# Patient Record
Sex: Female | Born: 1965 | Race: White | Hispanic: No | Smoking: Former smoker
Health system: Southern US, Community
[De-identification: ages and names within clinical notes are randomized; demographics above are authoritative.]

## PROBLEM LIST (undated history)

## (undated) DIAGNOSIS — C50919 Malignant neoplasm of unspecified site of unspecified female breast: Secondary | ICD-10-CM

## (undated) DIAGNOSIS — E079 Disorder of thyroid, unspecified: Secondary | ICD-10-CM

## (undated) DIAGNOSIS — E063 Autoimmune thyroiditis: Secondary | ICD-10-CM

## (undated) DIAGNOSIS — C44319 Basal cell carcinoma of skin of other parts of face: Secondary | ICD-10-CM

## (undated) HISTORY — PX: TOTAL LAPAROSCOPIC HYSTERECTOMY WITH BILATERAL SALPINGO OOPHORECTOMY: SHX6845

## (undated) HISTORY — DX: Basal cell carcinoma of skin of other parts of face: C44.319

## (undated) HISTORY — DX: Disorder of thyroid, unspecified: E07.9

---

## 1999-05-07 ENCOUNTER — Emergency Department (HOSPITAL_COMMUNITY): Admission: EM | Admit: 1999-05-07 | Discharge: 1999-05-07 | Payer: Self-pay

## 1999-05-08 ENCOUNTER — Encounter: Payer: Self-pay | Admitting: Gastroenterology

## 1999-05-08 ENCOUNTER — Ambulatory Visit (HOSPITAL_COMMUNITY): Admission: RE | Admit: 1999-05-08 | Discharge: 1999-05-08 | Payer: Self-pay | Admitting: Gastroenterology

## 1999-05-26 ENCOUNTER — Encounter: Payer: Self-pay | Admitting: Gastroenterology

## 1999-05-26 ENCOUNTER — Ambulatory Visit (HOSPITAL_COMMUNITY): Admission: RE | Admit: 1999-05-26 | Discharge: 1999-05-26 | Payer: Self-pay | Admitting: Gastroenterology

## 1999-09-04 ENCOUNTER — Ambulatory Visit (HOSPITAL_COMMUNITY): Admission: RE | Admit: 1999-09-04 | Discharge: 1999-09-04 | Payer: Self-pay | Admitting: Gastroenterology

## 1999-09-04 ENCOUNTER — Encounter: Payer: Self-pay | Admitting: Gastroenterology

## 1999-11-27 ENCOUNTER — Other Ambulatory Visit: Admission: RE | Admit: 1999-11-27 | Discharge: 1999-11-27 | Payer: Self-pay | Admitting: *Deleted

## 2003-08-10 ENCOUNTER — Other Ambulatory Visit: Admission: RE | Admit: 2003-08-10 | Discharge: 2003-08-10 | Payer: Self-pay | Admitting: Obstetrics and Gynecology

## 2004-03-15 ENCOUNTER — Inpatient Hospital Stay (HOSPITAL_COMMUNITY): Admission: AD | Admit: 2004-03-15 | Discharge: 2004-03-17 | Payer: Self-pay | Admitting: Obstetrics and Gynecology

## 2006-05-07 ENCOUNTER — Encounter: Admission: RE | Admit: 2006-05-07 | Discharge: 2006-05-07 | Payer: Self-pay | Admitting: Obstetrics and Gynecology

## 2007-05-09 ENCOUNTER — Encounter: Admission: RE | Admit: 2007-05-09 | Discharge: 2007-05-09 | Payer: Self-pay | Admitting: Obstetrics and Gynecology

## 2008-03-05 ENCOUNTER — Encounter: Admission: RE | Admit: 2008-03-05 | Discharge: 2008-03-05 | Payer: Self-pay | Admitting: Family Medicine

## 2008-06-03 ENCOUNTER — Encounter: Admission: RE | Admit: 2008-06-03 | Discharge: 2008-06-03 | Payer: Self-pay | Admitting: Obstetrics and Gynecology

## 2009-06-08 ENCOUNTER — Encounter: Admission: RE | Admit: 2009-06-08 | Discharge: 2009-06-08 | Payer: Self-pay | Admitting: Obstetrics and Gynecology

## 2009-07-25 ENCOUNTER — Encounter: Admission: RE | Admit: 2009-07-25 | Discharge: 2009-07-25 | Payer: Self-pay | Admitting: Family Medicine

## 2010-07-04 ENCOUNTER — Encounter: Admission: RE | Admit: 2010-07-04 | Discharge: 2010-07-04 | Payer: Self-pay | Admitting: Obstetrics and Gynecology

## 2010-12-22 NOTE — H&P (Signed)
NAME:  Jillian Hunt, Jillian Hunt                    ACCOUNT NO.:  [id number]   MEDICAL RECORD NO.:  [id number]                   PATIENT TYPE:  INP   LOCATION:  9133                                 FACILITY:  [facility]   PHYSICIAN:  Maxie Better, M.D.            DATE OF BIRTH:  15-Oct-1965   DATE OF ADMISSION:  03/15/2004  DATE OF DISCHARGE:                                HISTORY & PHYSICAL   CHIEF COMPLAINT:  Favorable cervix.  Suspect fetal macrosomia.   HISTORY OF PRESENT ILLNESS:  This is a 45 year old, gravida 3, para 1-0-1-1,  married white female, last menstrual period of June 11, 2003, [facility] of  March 17, 2004, consistent with an ultrasound done on July 27, 2003 at  6.[redacted] weeks gestation, who is now at 71 plus weeks gestation, being admitted  for induction of labor secondary to favorable cervix and suspicion of fetal  macrosomia.  The patient underwent an ultrasound on March 13, 2004, at which  time the estimated fetal weight was 4241 gm, 9 pounds, 7 ounces, 95%  percentile.  Upper normal amniotic fluid, with an amniotic fluid index of  19.5, which is at the 96th percentile.  The patient's history is notable for  a previous vacuum extraction of an 8 pound, 2 ounce baby after prolonged  pushing.  She reports a history of postpartum hemorrhage subsequently;  however, the review of the report and her records did not report that.  The  patient had been seen by Dr. Seymour Bars on March 10, 2004 in the office for  complaints of abdominal pruritic rash, which was subsequently diagnosed at  [facility].  The patient returned on March 13, 2004 for [facility] care with a fundal  height of 43 cm, and the ultrasound as noted.  The patient, based on the  ultrasound finding, was offered a cesarean section versus induction of  labor.  The patient was scheduled to see me on March 15, 2004 for a final  decision regarding the mode of delivery.  The patient's cervix was found to  be 3 cm, 80%, and -3 vertex  presentation.  Pros and cons of vaginal versus  primary cesarean section, issue of shoulder dystocia which is reduced with a  cesarean section but not eliminated, were reviewed with the patient and her  husband.  The possibility of need for cesarean section for arrest of  dilatation or arrest of descent was discussed with the patient.  She  initially opted to proceed with her scheduled cesarean section on March 17, 2004, and subsequently called and requested to be induced with the  understanding that it might not be successful, and she may had a cesarean  section as a result of the above possible reasons.  The patient has had some  pelvic pressure, no real notable contractions.  She has had good fetal  movements.  Her pregnancy otherwise has been uncomplicated.   History is notable for herpes simplex virus in the  past, for which she was  placed on suppressive therapy starting at 35 weeks.  Her group B strep  culture was negative.   Prenatal care was at [facility].   PRIMARY OBSTETRICIAN:  Maxie Better, M.D.   PRENATAL LABORATORIES:  Blood type was B positive.  Antibody screen  negative.  RPR was nonreactive.  Rubella was immune.  Hepatitis B surface  antigen negative.  HIV test was negative.  Glucose challenge test on November 28, 2003 was 117.  Group B strep culture was negative.  GC and Chlamydia  cultures were negative.  Pap smear was normal on August 10, 2003.  The  patient had first trimester genetic screening at the [facility] office.  She also had an AFP that was normal for open neural tube defect.  This was  done on October 07, 2003.  Normal anatomic fetal survey on November 02, 2003, at  which point she was 20.4 weeks.   PAST MEDICAL HISTORY:  SULFA produces hives.   MEDICATIONS:  1. Valtrex.  2. Prenatal vitamins.   MEDICAL HISTORY:  1. Prior history of hypothyroidism with no medications.  2. Prior history of asthma.  No recent exacerbation.  3. Irritable  bowel syndrome with no medications.   SURGERY:  1. Cryosurgery in 1991.  2. D&E in 1989.   OBSTETRICAL HISTORY:  1. In 1989, TAB.  2. In October of 2003, an 8 pound, 4 ounce baby at 38 weeks.  Six hours of     labor.  Vaginal delivery.  Placenta was difficult in removal, with     subsequent heavy bleeding per patient.   GYNECOLOGICAL HISTORY:  1. History of abnormal Paps which required surgery in the past, with normal     Pap smear since that time.  2. Herpes simplex virus in the past.   FAMILY HISTORY:  Sister with Graves' disease.  Maternal aunt with breast  cancer.  No ovarian or colon cancer.   SOCIAL HISTORY:  Married.  Nonsmoker.  One daughter.  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   REVIEW OF SYSTEMS:  Negative, except as noted in the history of present  illness.   PHYSICAL EXAMINATION:  GENERAL:  Well-developed, well-nourished, gravid  white female in no acute distress.  VITAL SIGNS:  Blood pressure 106/68, weight 183 pounds.  Fetal heart rate is  134.  SKIN:  An erythematous rash on abdomen, legs, and upper arms.  HEENT:  Anicteric sclerae.  Pink conjunctivae.  Oropharynx negative.  NECK:  Supple.  LUNGS:  Clear to auscultation.  HEART:  Regular rate and rhythm without murmur.  ABDOMEN:  Gravid.  Fundal height of 44 cm.  PELVIC:  3, 80, -3, vertex.  EXTREMITIES:  No edema.   IMPRESSION:  1. Term gestation.  2. Suspect possible large for gestational age baby.  3. History of herpes simplex with no recent outbreak.   PLAN:  Admission.  Amniotomy.  Routine admission laboratories.  Intrauterine  pressure catheter.  Low-dose Pitocin.  Epidural anesthesia.  A lengthy  discussion ensued with the patient prior to her induction regarding risk of  increased bleeding from a vaginal delivery or cesarean section, the  possibility of shoulder dystocia, and the risks associated with that, including nerve injury to the baby, possible need for  cesarean section.  Risk of  cesarean section was reviewed with the patient,  as well, and recommendations for an epidural anesthesia, in light of the  possible need for cesarean section if this is  not a successful vaginal  delivery.                                               Maxie Better, M.D.    [place]/MEDQ  D:  03/15/2004  T:  03/15/2004  Job:  [id number]

## 2011-08-13 ENCOUNTER — Other Ambulatory Visit: Payer: Self-pay | Admitting: Obstetrics and Gynecology

## 2011-08-13 DIAGNOSIS — Z1231 Encounter for screening mammogram for malignant neoplasm of breast: Secondary | ICD-10-CM

## 2011-08-24 ENCOUNTER — Ambulatory Visit
Admission: RE | Admit: 2011-08-24 | Discharge: 2011-08-24 | Disposition: A | Discharge status: Home / Self Care | Payer: BC Managed Care – PPO | Source: Ambulatory Visit | Attending: Obstetrics and Gynecology | Admitting: Obstetrics and Gynecology

## 2011-08-24 DIAGNOSIS — Z1231 Encounter for screening mammogram for malignant neoplasm of breast: Secondary | ICD-10-CM

## 2011-12-17 IMAGING — US US ABDOMEN COMPLETE
1 series · 14 of 25 positions shown · non-contrast
Comparison: Ultrasound of the abdomen of 03/05/2008

CLINICAL DATA: Epigastric pain

COMPLETE ABDOMINAL ULTRASOUND

[Series 1: us abdomen complete · 0.24mm/px · 14 of 88 slices shown]
[im 1/88]
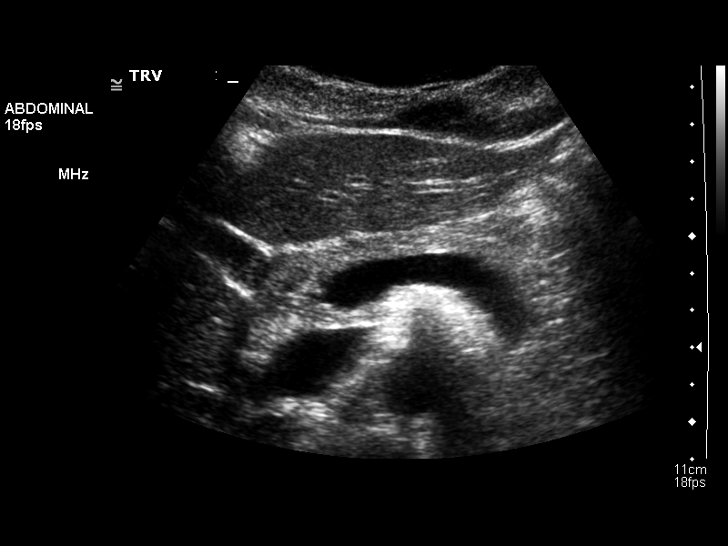
[im 8/88]
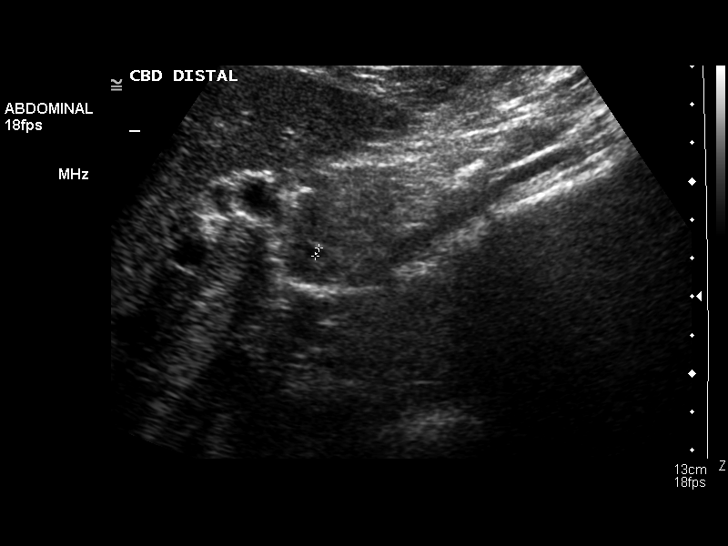
[im 15/88]
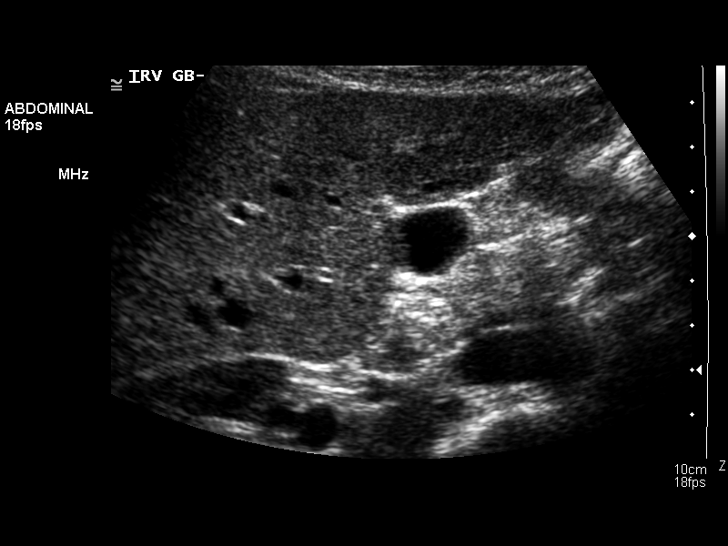
[im 22/88]
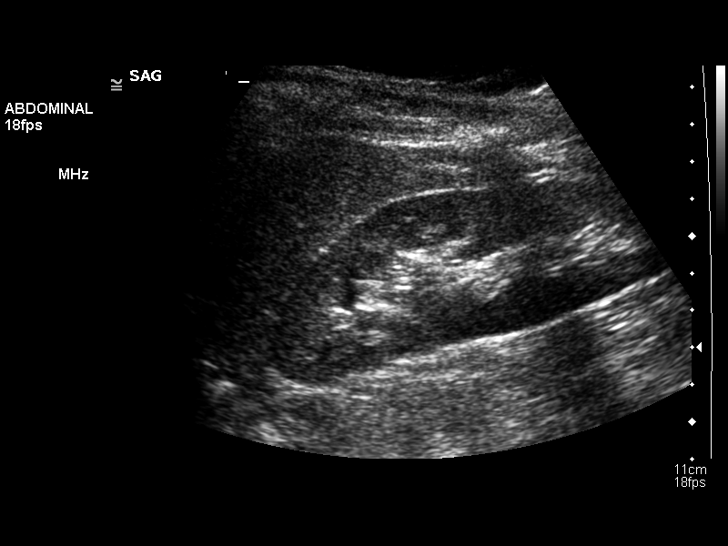
[im 30/88]
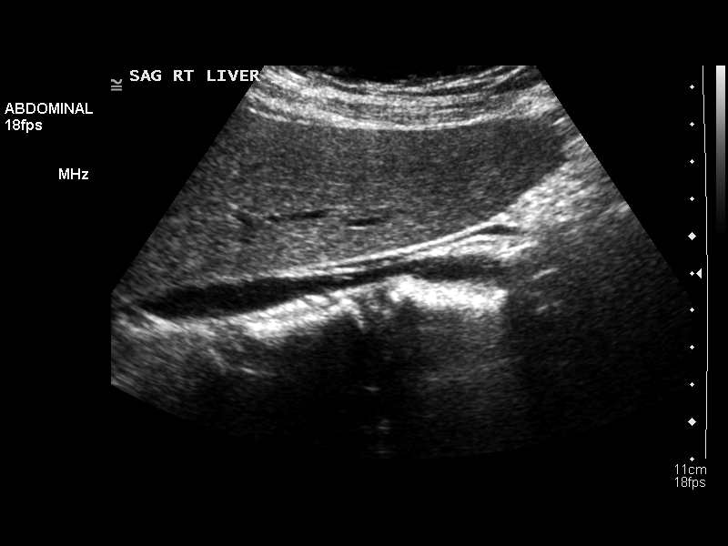
[im 33/88]
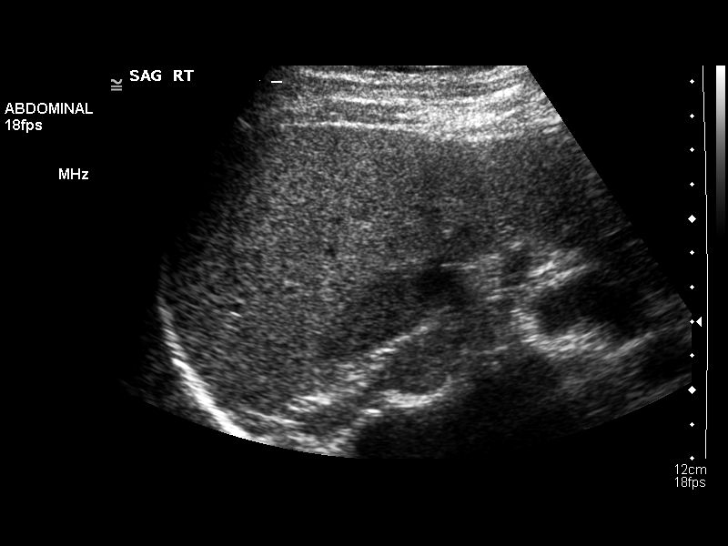
[im 40/88]
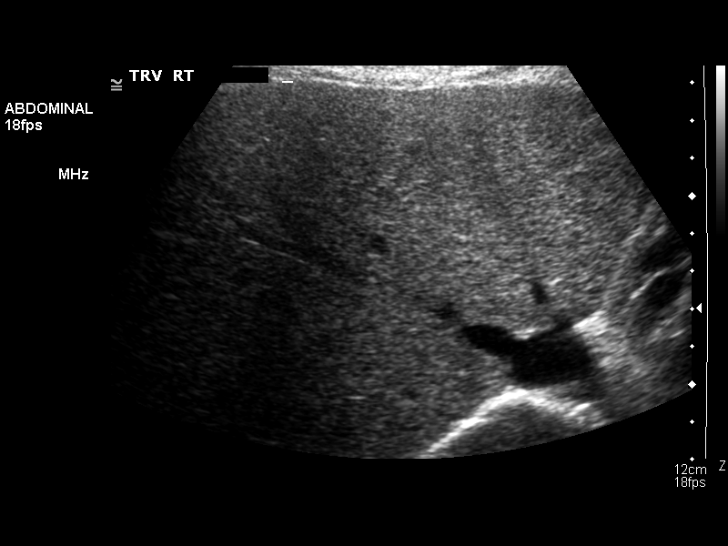
[im 48/88]
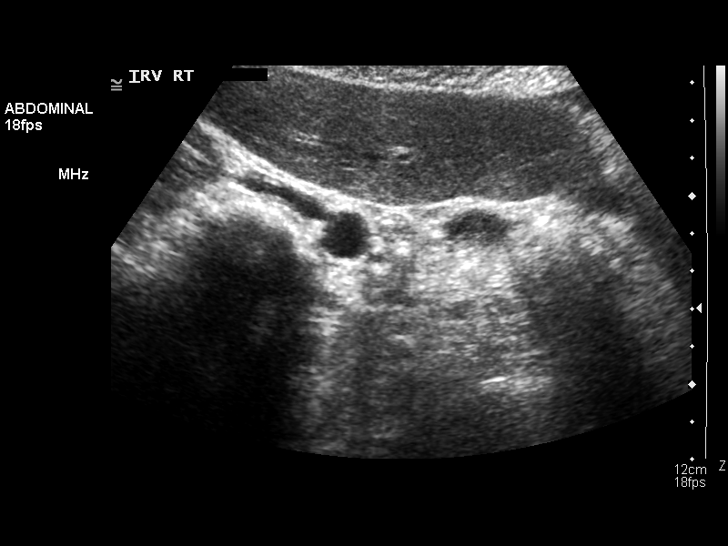
[im 55/88]
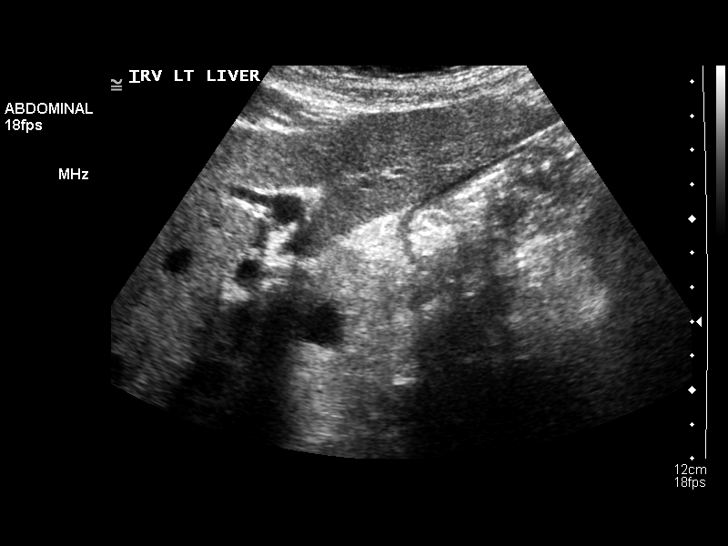
[im 59/88]
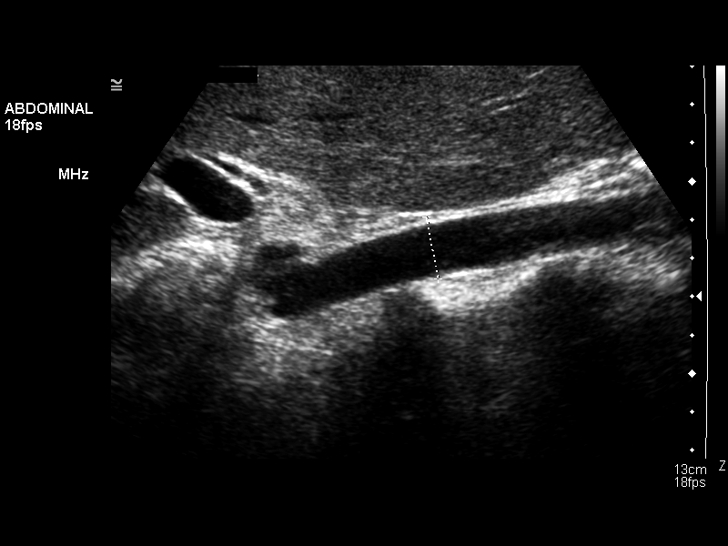
[im 66/88]
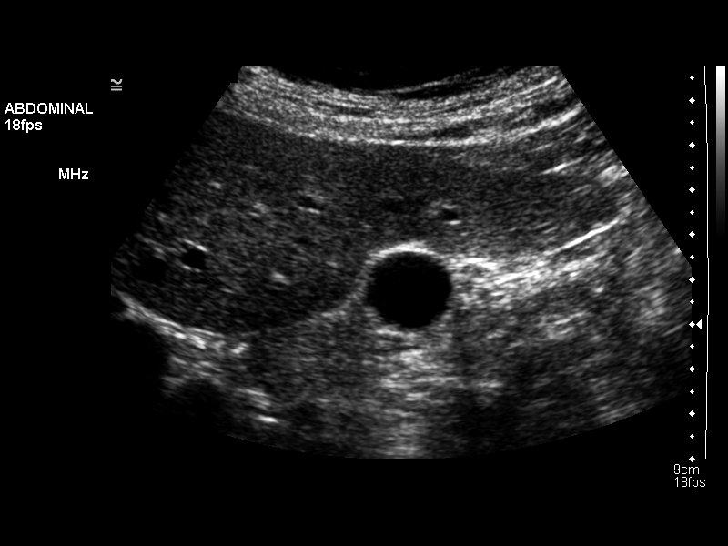
[im 73/88]
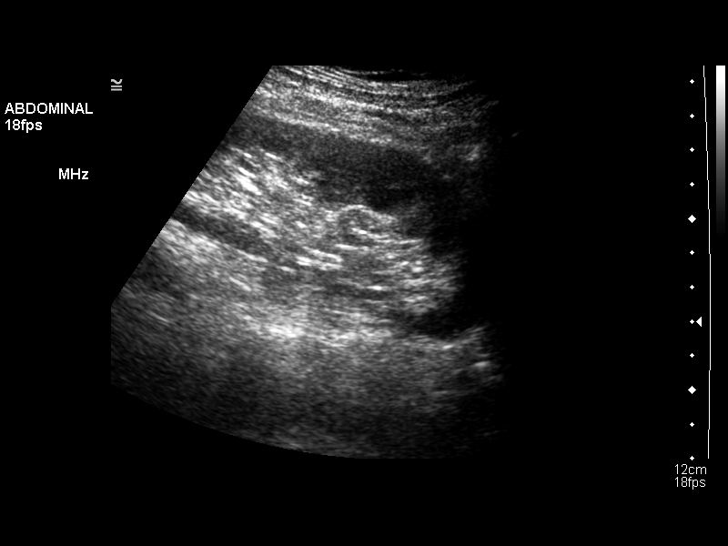
[im 80/88]
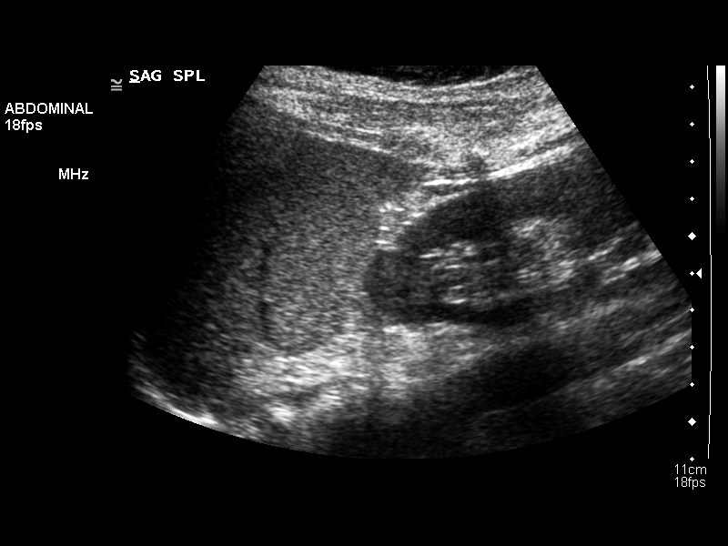
[im 88/88]
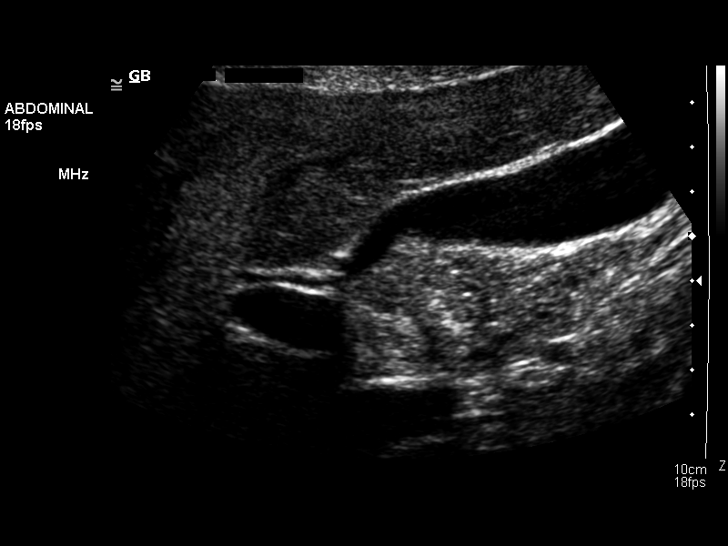

[14 of 25 positions shown; findings below may reference images not displayed]

FINDINGS: Gallbladder:  The gallbladder is well seen and no gallstones are
noted.  There is no pain over the gallbladder upon compression.

Common bile duct:  The common bile duct is normal measuring 4.3 mm
in diameter.

Liver:  The liver has a normal echogenic pattern.  A prominent
right lobe is consistent with normal variation of [place] lobe.

IVC:  Appears normal.

Pancreas:  No focal abnormality seen.

Spleen:  The spleen is normal measuring 10.0 cm sagittally.

Right Kidney:  No hydronephrosis is seen.  The right kidney
measures 12.4 cm sagittally.

Left Kidney:  No hydronephrosis.  The left kidney measures 12.5 cm.

Abdominal aorta:  The abdominal aorta is normal in caliber.
IMPRESSION: Negative abdominal ultrasound.

## 2012-08-18 ENCOUNTER — Other Ambulatory Visit: Payer: Self-pay | Admitting: Obstetrics and Gynecology

## 2012-08-18 DIAGNOSIS — Z1231 Encounter for screening mammogram for malignant neoplasm of breast: Secondary | ICD-10-CM

## 2012-09-05 ENCOUNTER — Ambulatory Visit
Admission: RE | Admit: 2012-09-05 | Discharge: 2012-09-05 | Disposition: A | Discharge status: Home / Self Care | Payer: BC Managed Care – PPO | Source: Ambulatory Visit | Attending: Obstetrics and Gynecology | Admitting: Obstetrics and Gynecology

## 2012-09-05 DIAGNOSIS — Z1231 Encounter for screening mammogram for malignant neoplasm of breast: Secondary | ICD-10-CM

## 2013-03-19 ENCOUNTER — Other Ambulatory Visit: Payer: Self-pay | Admitting: Obstetrics and Gynecology

## 2013-03-19 DIAGNOSIS — N63 Unspecified lump in unspecified breast: Secondary | ICD-10-CM

## 2013-03-20 ENCOUNTER — Other Ambulatory Visit: Payer: Self-pay

## 2013-03-20 DIAGNOSIS — C50919 Malignant neoplasm of unspecified site of unspecified female breast: Secondary | ICD-10-CM

## 2013-03-20 HISTORY — DX: Malignant neoplasm of unspecified site of unspecified female breast: C50.919

## 2013-03-23 ENCOUNTER — Other Ambulatory Visit: Payer: Self-pay | Admitting: Radiology

## 2013-03-23 DIAGNOSIS — C50911 Malignant neoplasm of unspecified site of right female breast: Secondary | ICD-10-CM

## 2013-03-26 ENCOUNTER — Other Ambulatory Visit (INDEPENDENT_AMBULATORY_CARE_PROVIDER_SITE_OTHER): Payer: Self-pay

## 2013-03-26 ENCOUNTER — Encounter (INDEPENDENT_AMBULATORY_CARE_PROVIDER_SITE_OTHER): Payer: Self-pay | Admitting: Surgery

## 2013-03-26 ENCOUNTER — Ambulatory Visit (INDEPENDENT_AMBULATORY_CARE_PROVIDER_SITE_OTHER): Payer: BC Managed Care – PPO | Admitting: Surgery

## 2013-03-26 VITALS — BP 132/92 | HR 88 | Temp 98.8°F | Resp 15 | Ht 67.0 in | Wt 155.0 lb

## 2013-03-26 DIAGNOSIS — C50911 Malignant neoplasm of unspecified site of right female breast: Secondary | ICD-10-CM

## 2013-03-26 DIAGNOSIS — D0592 Unspecified type of carcinoma in situ of left breast: Secondary | ICD-10-CM

## 2013-03-26 DIAGNOSIS — C50919 Malignant neoplasm of unspecified site of unspecified female breast: Secondary | ICD-10-CM | POA: Insufficient documentation

## 2013-03-26 DIAGNOSIS — C50912 Malignant neoplasm of unspecified site of left female breast: Secondary | ICD-10-CM

## 2013-03-26 NOTE — Progress Notes (Addendum)
Re:   Jillian Hunt DOB:   October 31, 1965 MRN:   [id number]  ASSESSMENT AND PLAN: 1.  Breast cancer, right.  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  Two areas on [place] - 3.9 cm (12:30 o'clock) and 1.6 cm (11:30 o'clock) on [place].  I discussed the options for breast cancer treatment with the patient.  I discussed a multidisciplinary approach to the treatment of breast cancer, which includes medical oncology and radiation oncology.  I discussed the surgical options of lumpectomy vs. mastectomy.  If mastectomy, there is the possibility of reconstruction.  Though with the size tumor she has now and the possibility of radiation tx, reconstruction may need to be delayed.   I discussed the options of lymph node biopsy.  The treatment plan depends on the pathologic staging of the tumor and the patient's personal wishes.  The risks of surgery include, but are not limited to, bleeding, infection, the need for further surgery, and nerve injury.  The patient has been given literature on the treatment of breast cancer from [facility].  She has the "Breast Cancer Journey book".  Plan:  1) MRI tomorrow, 2) Medical oncology consult  (she has requested Dr. Darnelle Catalan) to consider neoadjuvant chemotx, 3) Radiation oncology consult, 4) Genetics consultation, 5) I discussed the possibility of power port  When I first saw her, I did not have the reports from [place].  But with the reports, it is unclear whether she is still a lumpectomy candidate.  Will see what the MRI shows.  If she has multicentric disease, she will need a mastectomy.  If the two tumors in the right breast are close together and respond to neoadjuvant treatment, she may still be a candidate for "lumpectomy".  The tumor also appears to be a lobular carcinoma, which would be less likely to respond to neoadjuvant chemotx.  Will talk to patient after  MRI and med oncology consult.  [MRI - 03/27/2013 - In the right breast the mass measures 3.7 x 2.5 x 2.6 cm. There are multiple other masses/nodules located  lateral and inferior-lateral to the mass. The mass and additional  smaller masses/nodules measures 5.2 x 2.5 x 4.9 cm.   I think that this makes her a poor breast conservation candidate.  She has seen Dr. Ala Bent.  Dr. Darnelle Catalan has started Zoladex/Anastrazole.  Plan: 1) Oncotype to see if she will need chemotherapy, 2) genetics pending, 3) consider plastic surgery while we are waiting, but because of the risk of rad tx, she may not be a good candidate for immediate reconstruction.  I tried to call the patient, left message on answering machine.  DN  04/08/2013]  [Spoke with patient.  She is seeing Dr. Amie Critchley ([facility]) Wed., 9/10.  We will set her up to see a plastic surgeon here.  DN  04/09/2013] [She has decided to go to [facility].  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  She got at PET scan, CT scan, and bone scan.  She has a suspicious lesion in her liver - liver biopsy pending.  DN 04/21/2013]  2.  I discussed the possibility of a power port  I discussed the benefits and risks.  The risks include, but are not limited to, bleeding, infection, pneumothorax, and thrombosis.  3.  History of Hashimoto's - on thyroid replacement 4.  Has been on supplement estrogen for about one  year - she has stopped this.  Chief Complaint  Patient presents with  . New Evaluation    eva lft br cancer   REFERRING PHYSICIAN: MCNEILL,WENDY, MD  HISTORY OF PRESENT ILLNESS: Jillian Hunt is a 47 y.o. (DOB: 01/31/1966)  white  female whose primary care physician is MCNEILL,WENDY, MD and comes to me today for a newly diagnosed right breast cancer.  Her notes sort of trickled in while she was in the office, but I think I have all the data that has been collected as of now.  The patient felt a mass/fullness in her right breast about 3 to 4 weeks ago.  Her most  recent mammogram was at [facility] in 09/08/2012 and was negative.  She had an appointment to see Dr. Nelta Numbers in a couple of weeks after finding the mass and showed the area to her.  Dr. Cherly Hensen sent her for a mammogram. She had a mammogram/[facility] at [facility] on 03/20/2013. There appear to be two masses seen on [place].  One was 3.9 cm (12:30 o'clock) in diameter and one was 1.6 cm (11:30 o'clock) in diameter.  She had a biopsy of the larger mass in the right breast on the same day.  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  She has been on estrogen for about 1 year for peri menopausal symptoms.  She knows to stop this. Her last period was about one year ago.  She has an aunt (mother's sister) who had breast cancer.  She has two children and breast feed both.   Past Medical History  Diagnosis Date  . Thyroid disease      History reviewed. No pertinent past surgical history.    Current Outpatient Prescriptions  Medication Sig Dispense Refill  . levothyroxine (SYNTHROID, LEVOTHROID) 50 MCG tablet Take 50 mcg by mouth daily before breakfast.      . loratadine (CLARITIN) 10 MG tablet Take 10 mg by mouth daily.      . norethindrone-ethinyl estradiol (JUNEL FE,GILDESS FE,LOESTRIN FE) 1-20 MG-MCG tablet Take 1 tablet by mouth daily.      Marland Kitchen zolpidem (AMBIEN) 10 MG tablet Take 10 mg by mouth at bedtime as needed for sleep.       No current facility-administered medications for this visit.      Allergies  Allergen Reactions  . Sulfa Antibiotics Hives and Itching    REVIEW OF SYSTEMS: Skin:  No history of rash.  No history of abnormal moles. Infection:  No history of hepatitis or HIV.  No history of MRSA. Neurologic:  No history of stroke.  No history of seizure.  No history of headaches. Cardiac:  No history of hypertension. No history of heart disease.  No history of prior cardiac  catheterization.  No history of seeing a cardiologist. Pulmonary:  Does not smoke cigarettes.  No asthma or bronchitis.  No OSA/CPAP.  Endocrine:  No diabetes. History of Hashimoto's - on thyroid replacement.  At one time saw Dr. Talmage Nap, but now is followed by Dr. Uvaldo Rising. Gastrointestinal:  No history of stomach disease.  No history of liver disease.  No history of gall bladder disease.  No history of pancreas disease.  No history of colon disease. Urologic:  No history of kidney stones.  No history of bladder infections. Musculoskeletal:  No history of joint or back disease. Hematologic:  No bleeding disorder.  No history of anemia.  Not anticoagulated. Psycho-social:  The patient is oriented.   The patient has no obvious psychologic or social impairment to understanding our conversation and plan.  SOCIAL and FAMILY HISTORY: Married. Husband with her. Children are 9 and 45. 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  PHYSICAL EXAM: BP 132/92  Pulse 88  Temp(Src) 98.8 F (37.1 C) (Temporal)  Resp 15  Ht 5\' 7"  (1.702 m)  Wt 155 lb (70.308 kg)  BMI 24.27 kg/m2  General: WN WF who is alert and generally healthy appearing.  HEENT: Normal. Pupils equal. Neck: Supple. No mass.  No thyroid mass. Lymph Nodes:  No supraclavicular, cervical, or axillary nodes Lungs: Clear to auscultation and symmetric breath sounds. Heart:  RRR. No murmur or rub. Breast:  Right:  3 cm mass with bruise at 12 o'clock.  I do not feel the second mass seen on the [place] at [facility].  Left:  No mass  Abdomen: Soft. No mass. No tenderness. No hernia. Normal bowel sounds.  No abdominal scars. Rectal: Not done. Extremities:  Good strength and ROM  in upper and lower extremities. Neurologic:  Grossly intact to motor and sensory function. Psychiatric: Has normal mood and affect. Behavior is normal.   [place]:  I did an US of the larger mass in the office for a baseline size.  I measured  the tumor at 2.6 x 2.5 x 1.7 cm.  [photos taken].  DATA REVIEWED: Epic and notes from [place].  Ovidio Kin, MD,  [facility] [address]  [address]   [place], [place]    [address] Phone:  [phone] FAX:  [phone]

## 2013-03-27 ENCOUNTER — Ambulatory Visit
Admission: RE | Admit: 2013-03-27 | Discharge: 2013-03-27 | Disposition: A | Discharge status: Home / Self Care | Payer: BC Managed Care – PPO | Source: Ambulatory Visit | Attending: Radiology | Admitting: Radiology

## 2013-03-27 ENCOUNTER — Encounter: Payer: Self-pay | Admitting: Radiation Oncology

## 2013-03-27 ENCOUNTER — Telehealth: Payer: Self-pay | Admitting: *Deleted

## 2013-03-27 MED ORDER — GADOBENATE DIMEGLUMINE 529 MG/ML IV SOLN
14.0000 mL | Freq: Once | INTRAVENOUS | Status: AC | PRN
  Administered 2013-03-27: 14 mL via INTRAVENOUS

## 2013-03-27 NOTE — Progress Notes (Signed)
Location of Breast Cancer: right, 2 masses- 12;30 o'clock, 11:30 o'clock  Histology per Pathology Report:  03/20/13 Diagnosis Breast, right, needle core biopsy, mass - INVASIVE MAMMARY CARCINOMA. - MAMMARY CARCINOMA IN SITU.  Receptor Status: ER(100%), PR (65%), Her2-neu (-)  Did patient present with symptoms (if so, please note symptoms) or was this found on screening mammography?: pt palpated mass/fullness 3-4 weeks ago  Past/Anticipated interventions by surgeon, if any: pending MRI results from 03/27/13, no follow-up appointment w/Dr Ezzard Standing scheduled at this time, will see Dr Ezzard Standing after med onc appointment per Dr Allene Pyo 03/26/13 office note.  Past/Anticipated interventions by medical oncology, if any: Chemotherapy: new pt consult w/Dr Magrinat 9/9 /14  Lymphedema issues, if any:  no  Pain issues, if any:  no  SAFETY ISSUES:  Prior radiation? no  Pacemaker/ICD? no  Possible current pregnancy? no  Is the patient on methotrexate? 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 Pt denies pain, fatigue, loss of appetite. She states Ambien and Xanax which she takes prn for sleep give her a headache if she takes two nights in a row. Pt unsure if she has other options, will discuss w/Dr Roselind Messier today.    Jillian Hunt, [place] 03/27/2013,5:05 PM

## 2013-03-27 NOTE — Telephone Encounter (Signed)
Pt request Dr. Darnelle Catalan for med onc.  Scheduled pt for 04/14/16 at 4:00.  Confirmed appt date and time.  Mailed letter and intake form.  Pt denied further needs.  Gave instructions and contact information.

## 2013-03-31 ENCOUNTER — Other Ambulatory Visit: Payer: Self-pay | Admitting: Oncology

## 2013-04-01 ENCOUNTER — Ambulatory Visit
Admission: RE | Admit: 2013-04-01 | Discharge: 2013-04-01 | Disposition: A | Discharge status: Home / Self Care | Payer: BC Managed Care – PPO | Source: Ambulatory Visit | Attending: Radiation Oncology | Admitting: Radiation Oncology

## 2013-04-01 ENCOUNTER — Encounter: Payer: Self-pay | Admitting: Radiation Oncology

## 2013-04-01 ENCOUNTER — Telehealth: Payer: Self-pay | Admitting: *Deleted

## 2013-04-01 VITALS — BP 117/83 | HR 76 | Temp 98.3°F | Resp 20 | Wt 154.9 lb

## 2013-04-01 DIAGNOSIS — C50919 Malignant neoplasm of unspecified site of unspecified female breast: Secondary | ICD-10-CM | POA: Insufficient documentation

## 2013-04-01 DIAGNOSIS — Z170 Estrogen receptor positive status [ER+]: Secondary | ICD-10-CM | POA: Insufficient documentation

## 2013-04-01 DIAGNOSIS — Z87891 Personal history of nicotine dependence: Secondary | ICD-10-CM | POA: Insufficient documentation

## 2013-04-01 DIAGNOSIS — Z79899 Other long term (current) drug therapy: Secondary | ICD-10-CM | POA: Insufficient documentation

## 2013-04-01 DIAGNOSIS — C50911 Malignant neoplasm of unspecified site of right female breast: Secondary | ICD-10-CM

## 2013-04-01 HISTORY — DX: Malignant neoplasm of unspecified site of unspecified female breast: C50.919

## 2013-04-01 HISTORY — DX: Autoimmune thyroiditis: E06.3

## 2013-04-01 NOTE — Progress Notes (Signed)
Please see the Nurse Progress Note in the MD Initial Consult Encounter for this patient. 

## 2013-04-01 NOTE — Progress Notes (Signed)
Radiation Oncology         [phone] ________________________________  Initial outpatient Consultation  Name: Jillian Hunt MRN: [id number]  Date: 04/01/2013  DOB: 03-Jan-1966  WU:[id number],NWGNF, MD  Kandis Cocking, MD   REFERRING PHYSICIAN: Kandis Cocking, MD  DIAGNOSIS: Invasive lobular carcinoma of the right breast  HISTORY OF PRESENT ILLNESS::Jillian Hunt is a 47 y.o. female who is seen out of the courtesy of Dr. Ovidio Kin for an opinion concerning radiation therapy as part of the management of patient's recently diagnosed right breast cancer. The patient earlier this summer palpated a mass in the upper aspect of her right breast. She denied any pain or nipple discharge. She also complained of a heavy feeling in the right breast. The patient's screening mammography at performed earlier in the year on 09/05/2012 showing no suspicious areas in either breast. This is performed of the breast center of [place]. In light of her urgency issues, with a palpable mass, the patient was seen at the [facility]. A repeat digital mammography was performed of the right breast which again did not show any suspicious areas within the right breast. A right breast ultrasound however demonstrated a 3.9 x 3.7 cm irregular marginated solid mass. Approximately 2 cm  lateral to this area was a 1.6 x 1.3 similar mass at approximately the 11:00 position. There were no suspicious lymph nodes noted on ultrasound. The patient proceeded to undergo ultrasound guided biopsy of the right breast mass which revealed invasive ductal carcinoma with lobular features. E cadherin was weakly positive favoring lobular differentiation. The tumor was estrogen receptor positive at 100% and progesterone receptor positive at 65%. Proliferation marker was 47%. There was no HER-2/neu amplification. Patient was seen by Dr. Ezzard Standing and a MRI ordered. These findings are documented below. There was a dominant mass  within the breast and several (approximately  7 ) satellite nodules inferior to the dominant mass. With these findings the patient is now seen in consultation. Marland Kitchen  PREVIOUS RADIATION THERAPY: No  PAST MEDICAL HISTORY:  has a past medical history of Thyroid disease; Hashimoto's disease; and Breast cancer (03/20/13).    PAST SURGICAL HISTORY:History reviewed. No pertinent past surgical history.  FAMILY HISTORY: family history includes Birth defects in her maternal aunt and paternal aunt; Cancer in her maternal aunt, paternal aunt, and paternal aunt. no family history of ovarian cancer  SOCIAL HISTORY:  reports that she quit smoking about 24 years ago. Her smoking use included Cigarettes. She smoked 0.00 packs per day. She has never used smokeless tobacco. She reports that she does not drink alcohol or use illicit drugs.  ALLERGIES: Sulfa antibiotics  MEDICATIONS:  Current Outpatient Prescriptions  Medication Sig Dispense Refill  . ALPRAZolam (XANAX) 1 MG tablet Take 1 mg by mouth at bedtime as needed for sleep.      . fish oil-omega-3 fatty acids 1000 MG capsule Take 2 g by mouth daily.      Marland Kitchen levothyroxine (SYNTHROID, LEVOTHROID) 50 MCG tablet Take 50 mcg by mouth daily before breakfast.      . loratadine (CLARITIN) 10 MG tablet Take 10 mg by mouth daily.      . Multiple Vitamin (MULTIVITAMIN) capsule Take 1 capsule by mouth daily.      Marland Kitchen zolpidem (AMBIEN) 10 MG tablet Take 10 mg by mouth at bedtime as needed for sleep.       No current facility-administered medications for this encounter.    REVIEW OF SYSTEMS:  A  15 point review of systems is documented in the electronic medical record. This was obtained by the nursing staff. However, I reviewed this with the patient to discuss relevant findings and make appropriate changes.  She has had some headaches when taking at Ambien or Xanax. She denies any visual problems. She has had some discomfort which she describes as a tingling sensation  along the right scapula area. Prior to diagnosis the patient denied any nipple discharge or bleeding. She denied any nipple inversion. She has noticed heaviness to the right breast. She denies the symptoms along the left breast.   PHYSICAL EXAM:  weight is 154 lb 14.4 oz (70.262 kg). Her oral temperature is 98.3 F (36.8 C). Her blood pressure is 117/83 and her pulse is 76. Her respiration is 20.  this is a very pleasant healthy-appearing 47 year old female in no acute distress. She is accompanied by her husband on evaluation today. The pupils are equal round and reactive to light. The extraocular eye movements are intact. The tongue is midline. There is no secondary infection noted the oral cavity or posterior pharynx. Examination of the neck supraclavicular and axillary areas reveals no evidence of adenopathy. The lungs are clear to auscultation. The heart has a regular rhythm and rate. The abdomen is soft and nontender with normal bowel sounds. On neurological examination motor strength is 5 out of 5 in the proximal and distal muscle groups of the upper and lower extremities. Palpation along the upper spine and scapula area reveals no point tenderness. Examination of the left breast reveals no mass or nipple discharge. Examination of the right breast reveals a dominant mass in the upper central aspect of the breast. This mass measures approximately 5 x 5 cm with palpation. This mass starts ~ 1-1/2 cm from the areolar border.  There is some bruising noted in the upper aspect of the breast. There is no nipple discharge or bleeding noted.   KPS = 100  100 - Normal; no complaints; no evidence of disease. 90   - Able to carry on normal activity; minor signs or symptoms of disease. 80   - Normal activity with effort; some signs or symptoms of disease. 50   - Cares for self; unable to carry on normal activity or to do active work. 60   - Requires occasional assistance, but is able to care for most of his  personal needs. 50   - Requires considerable assistance and frequent medical care. 40   - Disabled; requires special care and assistance. 30   - Severely disabled; hospital admission is indicated although death not imminent. 20   - Very sick; hospital admission necessary; active supportive treatment necessary. 10   - Moribund; fatal processes progressing rapidly. 0     - Dead  Karnofsky DA, Abelmann WH, Craver LS and Burchenal JH [phone]) The use of the nitrogen mustards in the palliative treatment of carcinoma: with particular reference to bronchogenic carcinoma Cancer 1 634-56  LABORATORY DATA:  No results found for this basename: WBC, HGB, HCT, MCV, PLT   No results found for this basename: NA, K, CL, CO2   No results found for this basename: ALT, AST, GGT, ALKPHOS, BILITOT     RADIOGRAPHY: Mr Breast Bilateral W Wo Contrast  03/27/2013   *RADIOLOGY REPORT*  Clinical Data:47 year old female recently diagnosed with right breast invasive mammary carcinoma and mammary carcinoma in situ.  BILATERAL BREAST MRI WITH AND WITHOUT CONTRAST  Technique: Multiplanar, multisequence MR images of both breasts were  obtained prior to and following the intravenous administration of 14ml of multihance.  THREE-DIMENSIONAL MR IMAGE RENDERING ON INDEPENDENT WORKSTATION: Three-dimensional MR images were rendered by post-processing  of the original MR data on an independent DynaCad workstation. The three-dimensional MR images were interpreted, and findings are reported in the following complete MRI report for this study.  Comparison:  Mammograms dated 03/20/2013, 09/05/2012 and 08/24/2011 from [facility].  FINDINGS:  Breast composition:  c:  Heterogeneous fibroglandular tissue  Background parenchymal enhancement: Moderate.  Right breast:  In the posterior third of the upper inner quadrant of the right breast there is a lobulated, enhancing mass associated with a signal void artifact (biopsy clip).  The mass  measures 3.7 x 2.5 x 2.6 cm.  There are multiple other masses/nodules located lateral and inferior-lateral to the mass.  The mass and additional smaller masses/nodules measures 5.2 x 2.5 x 4.9 cm.  There are at least seven inferior nodules located 1.6 cm from the largest mass (the largest of these nodules measures 5 mm).  There are 1.1 and 1.0 cm satellite masses located lateral and adjacent to the mass.  Left breast:  No mass or abnormal enhancement.  Lymph nodes:  No abnormal appearing lymph nodes.  Ancillary findings:  None.  IMPRESSION: Enhancing mass in the upper inner quadrant of the right breast. Multiple smaller enhancing masses and nodules are seen, as described above.  RECOMMENDATION: Treatment planning of the right breast is recommended.  If the extent of disease is needed for conservative therapy, I would recommend MR guided core biopsies of one of the inferior nodules with MR guidance.  BI-RADS CATEGORY 6:  Known biopsy-proven malignancy - appropriate action should be taken.   Original Report Authenticated By: Baird Lyons, M.D.      IMPRESSION: Probable multifocal invasive lobular carcinoma of the right breast. I discussed the pathologic findings in detail with the patient and her husband. We also reviewed the patient's MRI which documented the dominant mass with several satellite nodules adjacent to the dominant mass. Today we discussed general options for local management including partial mastectomy and sentinel node procedure followed by radiation therapy. We also discussed mastectomy and potential postmastectomy irradiation as part of her overall management. I discussed with the patient that she would require additional biopsy if she is interested in proceeding with breast conserving therapy. My overall impression however is that breast conserving therapy would be quite difficult given the MRI findings as above and size of the mass as it relates to breast size.  With the findings of lobular  carcinoma and the MRI findings as above it is doubtful that the patient may be a good candidate for neoadjuvant treatment but will await medical oncology input concerning this issue.  PLAN: Medical oncology evaluation in the near future  I spent 60 minutes minutes face to face with the patient and more than 50% of that time was spent in counseling and/or coordination of care.   ------------------------------------------------  -----------------------------------  Billie Lade, PhD, MD

## 2013-04-01 NOTE — Telephone Encounter (Signed)
Called and spoke with patient and rescheduled her appt per Dr. Darnelle Catalan to 04/07/13 at 1100.

## 2013-04-03 ENCOUNTER — Telehealth (INDEPENDENT_AMBULATORY_CARE_PROVIDER_SITE_OTHER): Payer: Self-pay

## 2013-04-03 ENCOUNTER — Other Ambulatory Visit (INDEPENDENT_AMBULATORY_CARE_PROVIDER_SITE_OTHER): Payer: Self-pay

## 2013-04-03 DIAGNOSIS — D0592 Unspecified type of carcinoma in situ of left breast: Secondary | ICD-10-CM

## 2013-04-03 NOTE — Telephone Encounter (Signed)
After speaking to Maylon Cos in [place] , I called patient to inform her of her appointments. Genetic consulting is @ 10am, Labs @11am  , Dr. Darnelle Catalan @ 1130 am patient verbalized understanding of location, date times

## 2013-04-06 ENCOUNTER — Other Ambulatory Visit: Payer: Self-pay | Admitting: Oncology

## 2013-04-06 DIAGNOSIS — C50919 Malignant neoplasm of unspecified site of unspecified female breast: Secondary | ICD-10-CM

## 2013-04-07 ENCOUNTER — Other Ambulatory Visit (HOSPITAL_BASED_OUTPATIENT_CLINIC_OR_DEPARTMENT_OTHER): Payer: BC Managed Care – PPO | Admitting: Lab

## 2013-04-07 ENCOUNTER — Other Ambulatory Visit: Payer: Self-pay | Admitting: Oncology

## 2013-04-07 ENCOUNTER — Ambulatory Visit (HOSPITAL_BASED_OUTPATIENT_CLINIC_OR_DEPARTMENT_OTHER): Payer: Self-pay | Admitting: Genetic Counselor

## 2013-04-07 ENCOUNTER — Other Ambulatory Visit: Payer: Self-pay | Admitting: *Deleted

## 2013-04-07 ENCOUNTER — Ambulatory Visit (HOSPITAL_BASED_OUTPATIENT_CLINIC_OR_DEPARTMENT_OTHER): Payer: BC Managed Care – PPO | Admitting: Oncology

## 2013-04-07 ENCOUNTER — Encounter: Payer: Self-pay | Admitting: Genetic Counselor

## 2013-04-07 VITALS — BP 154/94 | HR 69 | Temp 98.6°F | Resp 20 | Ht 67.0 in | Wt 152.1 lb

## 2013-04-07 DIAGNOSIS — C50219 Malignant neoplasm of upper-inner quadrant of unspecified female breast: Secondary | ICD-10-CM

## 2013-04-07 DIAGNOSIS — C50211 Malignant neoplasm of upper-inner quadrant of right female breast: Secondary | ICD-10-CM

## 2013-04-07 DIAGNOSIS — C50919 Malignant neoplasm of unspecified site of unspecified female breast: Secondary | ICD-10-CM

## 2013-04-07 DIAGNOSIS — Z170 Estrogen receptor positive status [ER+]: Secondary | ICD-10-CM

## 2013-04-07 DIAGNOSIS — Z5111 Encounter for antineoplastic chemotherapy: Secondary | ICD-10-CM

## 2013-04-07 DIAGNOSIS — IMO0002 Reserved for concepts with insufficient information to code with codable children: Secondary | ICD-10-CM

## 2013-04-07 LAB — CBC WITH DIFFERENTIAL/PLATELET
BASO%: 0.5 % (ref 0.0–2.0)
Basophils Absolute: 0 10*3/uL (ref 0.0–0.1)
EOS%: 2.4 % (ref 0.0–7.0)
Eosinophils Absolute: 0.2 10*3/uL (ref 0.0–0.5)
HCT: 39.3 % (ref 34.8–46.6)
HGB: 13.7 g/dL (ref 11.6–15.9)
LYMPH%: 33.2 % (ref 14.0–49.7)
MCH: 28.9 pg (ref 25.1–34.0)
MCHC: 34.7 g/dL (ref 31.5–36.0)
MCV: 83.1 fL (ref 79.5–101.0)
MONO#: 0.4 10*3/uL (ref 0.1–0.9)
MONO%: 5.9 % (ref 0.0–14.0)
NEUT#: 3.9 10*3/uL (ref 1.5–6.5)
NEUT%: 58 % (ref 38.4–76.8)
Platelets: 210 10*3/uL (ref 145–400)
RBC: 4.73 10*6/uL (ref 3.70–5.45)
RDW: 12.6 % (ref 11.2–14.5)
WBC: 6.8 10*3/uL (ref 3.9–10.3)
lymph#: 2.3 10*3/uL (ref 0.9–3.3)

## 2013-04-07 LAB — COMPREHENSIVE METABOLIC PANEL (CC13)
ALT: 41 U/L (ref 0–55)
AST: 28 U/L (ref 5–34)
Albumin: 4.1 g/dL (ref 3.5–5.0)
Alkaline Phosphatase: 49 U/L (ref 40–150)
BUN: 8.6 mg/dL (ref 7.0–26.0)
CO2: 25 mEq/L (ref 22–29)
Calcium: 9.7 mg/dL (ref 8.4–10.4)
Chloride: 107 mEq/L (ref 98–109)
Creatinine: 0.8 mg/dL (ref 0.6–1.1)
Glucose: 93 mg/dl (ref 70–140)
Potassium: 3.8 mEq/L (ref 3.5–5.1)
Sodium: 141 mEq/L (ref 136–145)
Total Bilirubin: 0.41 mg/dL (ref 0.20–1.20)
Total Protein: 7.4 g/dL (ref 6.4–8.3)

## 2013-04-07 MED ORDER — GOSERELIN ACETATE 3.6 MG ~~LOC~~ IMPL
3.6000 mg | DRUG_IMPLANT | Freq: Once | SUBCUTANEOUS | Status: AC
  Administered 2013-04-07: 3.6 mg via SUBCUTANEOUS
  Filled 2013-04-07: qty 3.6

## 2013-04-07 MED ORDER — ANASTROZOLE 1 MG PO TABS: 1.0000 mg | ORAL_TABLET | Freq: Every day | ORAL | Status: DC

## 2013-04-07 NOTE — Progress Notes (Signed)
Patient ID: Jillian Hunt, female   DOB: 1966/02/20, 47 y.o.   MRN: [id number] ID: Sherene Sires OB: [birth year]  MR#: [id number]  YQM#:[id number]  PCP: Gweneth Dimitri, MD GYN:   SU: Ovidio Kin OTHER MD: Antony Blackbird   HISTORY OF PRESENT ILLNESS: The patient has category 4 ("extremely dense") breast tissue. Screening mammography January 2014 was unremarkable. Subsequently however the patient noted a lump in the upper inner quadrant of her right breast and brought this to the attention of Dr. Cherly Hensen. Right mammography and ultrasonography 03/20/2013 was again unremarkable, and a single axillary lymph node in the right axilla was unchanged from the prior exam. Ultrasonography however at the site of the palpable lump (superiorly in the upper inner quadrant of the right breast) found a 3.9 cm irregular her solid mass and a second mass measuring 1.6 cm separate from the larger mass by approximately 2 cm. Ultrasound of the right axilla was benign.  Biopsy of the larger mass in question 03/20/2013 showed ([id number]) and invasive lobular carcinoma, confirmed by patchy andweak E-cadherin positivity, estrogen receptor 100% positive, progesterone receptor 65% positive, with an MIB-1 of 47% and no HER-2 amplification.  Bilateral breast MRI 03/27/2013 showed in the upper inner quadrant of the right breast a lobulated enhancing mass measuring 3.7 cm. There were multiple other masses laterally and inferior to this mass, altogether measuring 5.2 cm. There are at least 7 inferior nodules located 1.6 cm from the largest mass however there were no abnormal appearing lymph nodes in the left breast was unremarkable.  The patient's subsequent history is as detailed below.  INTERVAL HISTORY: Jillian Hunt establish yourself in my practice today. She was accompanied by her husband Jillian Hunt.  REVIEW OF SYSTEMS: Aside from anxiety from the new diagnosis and concern regarding the urgency of getting treatments started,  a detailed review of systems today was entirely benign.  PAST MEDICAL HISTORY: Past Medical History  Diagnosis Date  . Thyroid disease   . Hashimoto's disease   . Breast cancer 03/20/13    right breast    PAST SURGICAL HISTORY: No past surgical history on file.  FAMILY HISTORY Family History  Problem Relation Age of Onset  . Stomach cancer Maternal Aunt     dx in her late 9s  . Cancer Paternal Aunt     stomach  . Cancer Paternal Aunt     lymph node in neck  . Skin cancer Father     either BCC or SCC  . Cancer Maternal Aunt     started in enlarged lymph node  . Healthy Daughter   . Healthy Son   The patient's father is currently 65 years old. Her mother is 36 years old. The patient has no brothers, 2 sisters. There is no breast or ovarian cancer in the immediate family. One of the patient's mother is 2 sisters was diagnosed with breast cancer in her 30s. The patient's father had a sister diagnosed with "stomach cancer" also in her 30s (this was gastric cancer, not ovarian cancer).  GYNECOLOGIC HISTORY:  Menarche age 37, first live birth age 87. The patient is GX P2. She has been on estrogen pill for the past few years, stopping only at the time of her breast cancer diagnosis.  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 Her husband Jillian Hunt works as a Risk analyst for at [employer]. Their children are Jillian Hunt, 74, and Jillian Hunt (as in [employer]"), 9. The patient attends [employer]    ADVANCED DIRECTIVES:    HEALTH MAINTENANCE: History  Substance Use Topics  . Smoking status: Former Smoker -- 0.15 packs/day for 4 years    Types: Cigarettes    Quit date: 08/06/1988  . Smokeless tobacco: Never Used     Comment: socially in college  . 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  Sulfa Antibiotics Hives and Itching    Current Outpatient Prescriptions  Medication Sig Dispense Refill  . ALPRAZolam (XANAX) 1 MG tablet Take 1 mg by mouth at bedtime as needed for sleep.      Marland Kitchen anastrozole (ARIMIDEX) 1 MG tablet Take 1 tablet (1 mg total) by mouth daily.  90 tablet  12  . fish oil-omega-3 fatty acids 1000 MG capsule Take 2 g by mouth daily.      Marland Kitchen levothyroxine (SYNTHROID, LEVOTHROID) 50 MCG tablet Take 50 mcg by mouth daily before breakfast.      . loratadine (CLARITIN) 10 MG tablet Take 10 mg by mouth daily.      . Multiple Vitamin (MULTIVITAMIN) capsule Take 1 capsule by mouth daily.      Marland Kitchen zolpidem (AMBIEN) 10 MG tablet Take 10 mg by mouth at bedtime as needed for sleep.       No current facility-administered medications for this visit.    OBJECTIVE: young-appearing white woman in no acute distress Filed Vitals:   04/07/13 1121  BP: 154/94  Pulse: 69  Temp: 98.6 F (37 C)  Resp: 20     Body mass index is 23.82 kg/(m^2).    ECOG FS:0 - Asymptomatic  Sclerae unicteric Oropharynx clear No cervical or supraclavicular adenopathy Lungs no rales or rhonchi Heart regular rate and rhythm Abd benign MSK no focal spinal tenderness, no peripheral edema Neuro: non-focal, well-oriented, appropriate affect Breasts: there is a mass in the upper-inner quadrant of the right breast, primarily superior, measuring approximately 2-1/2 cm by palpation. It is movable. There is no skin erythema or ulceration, no dimpling or peau d'orange. The nipple is unremarkable. The right axilla is benign. The left breast is unremarkable.   LAB RESULTS:  CMP     Component Value Date/Time   NA 141 04/07/2013 1058    I No results found for this basename: SPEP,  UPEP,   kappa and lambda light chains    Lab Results  Component Value Date   WBC 6.8 04/07/2013   NEUTROABS 3.9 04/07/2013   HGB 13.7 04/07/2013   HCT 39.3 04/07/2013   MCV 83.1 04/07/2013   PLT 210 04/07/2013      Chemistry       Component Value Date/Time   NA 141 04/07/2013 1058      Component Value Date/Time   CALCIUM 9.7 04/07/2013 1058       No results found for this basename: LABCA2    No components found with this basename: LABCA125    No results found for this basename: INR,  in the last 168 hours  Urinalysis No results found for this basename: colorurine,  appearanceur,  labspec,  phurine,  glucoseu,  hgbur,  bilirubinur,  ketonesur,  proteinur,  urobilinogen,  nitrite,  leukocytesur    STUDIES: Mr Breast Bilateral W Wo Contrast  03/27/2013   *RADIOLOGY REPORT*  Clinical Data:47 year old female recently diagnosed with right breast  invasive mammary carcinoma and mammary carcinoma in situ.  BILATERAL BREAST MRI WITH AND WITHOUT CONTRAST  Technique: Multiplanar, multisequence MR images of both breasts were obtained prior to and following the intravenous administration of 14ml of multihance.  THREE-DIMENSIONAL MR IMAGE RENDERING ON INDEPENDENT WORKSTATION: Three-dimensional MR images were rendered by post-processing  of the original MR data on an independent DynaCad workstation. The three-dimensional MR images were interpreted, and findings are reported in the following complete MRI report for this study.  Comparison:  Mammograms dated 03/20/2013, 09/05/2012 and 08/24/2011 from [facility].  FINDINGS:  Breast composition:  c:  Heterogeneous fibroglandular tissue  Background parenchymal enhancement: Moderate.  Right breast:  In the posterior third of the upper inner quadrant of the right breast there is a lobulated, enhancing mass associated with a signal void artifact (biopsy clip).  The mass measures 3.7 x 2.5 x 2.6 cm.  There are multiple other masses/nodules located lateral and inferior-lateral to the mass.  The mass and additional smaller masses/nodules measures 5.2 x 2.5 x 4.9 cm.  There are at least seven inferior nodules located 1.6 cm from the largest mass (the largest of these nodules measures 5  mm).  There are 1.1 and 1.0 cm satellite masses located lateral and adjacent to the mass.  Left breast:  No mass or abnormal enhancement.  Lymph nodes:  No abnormal appearing lymph nodes.  Ancillary findings:  None.  IMPRESSION: Enhancing mass in the upper inner quadrant of the right breast. Multiple smaller enhancing masses and nodules are seen, as described above.  RECOMMENDATION: Treatment planning of the right breast is recommended.  If the extent of disease is needed for conservative therapy, I would recommend MR guided core biopsies of one of the inferior nodules with MR guidance.  BI-RADS CATEGORY 6:  Known biopsy-proven malignancy - appropriate action should be taken.   Original Report Authenticated By: Baird Lyons, M.D.    ASSESSMENT: 47 y.o. [place] woman status post right breast biopsy 03/20/2013 for a clinical mT2 N0, stage IIA invasive lobular breast cancer, estrogen receptor 100% positive, progesterone receptor 65% positive, with an MIB-1 of 47% and no HER-2 amplification.  (1) started on goserelin/ anastrozole 04/07/2013  PLAN: We spent the better part of today's hour-long visit discussing the general biology of breast cancer and the specifics of Molli's situation. In terms of local treatment options, I do not believe she is going to be able to avoid mastectomy. She understands first of all there is extensive disease both by ultrasound and MRI in the right breast, and in addition obtaining clear margins is especially difficult with lobular tumors. Whether or not she will need postmastectomy radiation, if she does undergo mastectomy, will depend on the final pathology results.  As far as systemic treatment is concerned, the big question is chemotherapy. As a young woman with large breast masses the presumption is that she would significantly benefit from chemotherapy. However this is a lobular, estrogen receptor positive tumor. It could be that the benefit of chemotherapy might be much  less than expected. For this reason I am requesting an Oncotype DX be sent from her initial biopsy. If the reading ends up in the intermediate or high risk groups then she would be a candidate for chemotherapy.  In the meantime we are starting antiestrogen treatment with goserelin and anastrozole. This will allow her to take time to get genetically tested, meet with plastic surgery, and decide whether she wants mastectomy, double mastectomy, or lumpectomy, and if  she does decide on mastectomy what kind of reconstruction she might consider.  She has a good understanding of the possible toxicities, side effects and complications of her new medications and I expect she will have significant postmenopausal symptoms develop within the next few weeks. I made her a return appointment with me in one month, to receive her second goserelin dose, but she knows to call for any problems that may develop before that visit.   Lowella Dell, MD   04/07/2013 1:26 PM

## 2013-04-07 NOTE — Progress Notes (Signed)
Pt did not have on armband to scan for zoladex injection. This RN verified pt by name and dob. Medication verified with MD of zoladex 3.6 mg which was administered in her abdomen. Pt tolerated procedure with no complaints.

## 2013-04-07 NOTE — Progress Notes (Signed)
Dr.  Ovidio Kin requested a consultation for genetic counseling and risk assessment for Jillian Hunt, a 47 y.o. female, for discussion of her personal history of breast cancer and family history of stomach and breast cancer.  She presents to clinic today to discuss the possibility of a genetic predisposition to cancer, and to further clarify her risks, as well as her family members' risks for cancer.   HISTORY OF PRESENT ILLNESS: In 2014, at the age of 74, Jillian Hunt was diagnosed with breast cancer of the right breast. Her breast cancer has features of both lobular and ductal carcinoma.  The tumor is ER+/PR+/Her2-.  She has been diagnosed with hashimoto's thyroiditis.    Past Medical History  Diagnosis Date  . Thyroid disease   . Hashimoto's disease   . Breast cancer 03/20/13    right breast    History reviewed. No pertinent past surgical history.  History   Social History  . Marital Status: Married    Spouse Name: N/A    Number of Children: 2  . Years of Education: N/A   Occupational History  .     Social History Main Topics  . Smoking status: Former Smoker -- 0.15 packs/day for 4 years    Types: Cigarettes    Quit date: 08/06/1988  . Smokeless tobacco: Never Used     Comment: socially in college  . Alcohol Use: No     Comment: just quit  . Drug Use: No  . Sexual Activity: None     Comment: menopause 1 yr ago, HRT x 1 year   Other Topics Concern  . None   Social History Narrative  . None    REPRODUCTIVE HISTORY AND PERSONAL RISK ASSESSMENT FACTORS: Menarche was at age 40.   perimenopausal Uterus Intact: yes Ovaries Intact: yes G2P2A0, first live birth at age 65  She has not previously undergone treatment for infertility.   Oral Contraceptive use: 15 years   She has not used HRT in the past.    FAMILY HISTORY:  We obtained a detailed, 4-generation family history.  Significant diagnoses are listed below: Family History  Problem  Relation Age of Onset  . Stomach cancer Maternal Aunt     dx in her late 87s  . Cancer Paternal Aunt     stomach  . Cancer Paternal Aunt     lymph node in neck  . Skin cancer Father     either BCC or SCC  . Cancer Maternal Aunt     started in enlarged lymph node  . Healthy Daughter   . Healthy Son    Maternal aunt was diagnosed with breast cancer in her 30s.  She reportedly had a dbl mastectomy and passed away in her 87s. Patient's maternal ancestors are of [place] descent, and paternal ancestors are of [place] descent. There is no reported Ashkenazi Jewish ancestry. There is no known consanguinity.  GENETIC COUNSELING ASSESSMENT: Jillian Hunt is a 47 y.o. female with a personal history of breast cancer and family history of breast and stomach cancer which somewhat suggestive of a hereditary cancer syndrome and predisposition to cancer. We, therefore, discussed and recommended the following at today's visit.   DISCUSSION: We reviewed the characteristics, features and inheritance patterns of hereditary cancer syndromes. We also discussed genetic testing, including the appropriate family members to test, the process of testing, insurance coverage and turn-around-time for results. We discussed the increased risk for mutations within BRCA1 and BRCA2 based on  her personal and family history of early onset breast cancer.  Additioanlly we discussed the concern for CDH1 mutations based on the early onset of breast and stomach cancer on her father's side of the family.  In order to estimate her chance of having a BRCA mutation, we used statistical models (Penn II) and laboratory data that take into account her personal medical history, family history and ancestry.  Because each model is different, there can be a lot of variability in the risks they give.  Therefore, these numbers must be considered a rough range and not a precise risk of having a BRCA mutation.  These models estimate that she has  approximately a 14% chance of having a mutation. Based on this assessment of her family and personal history, genetic testing is recommended.  PLAN: After considering the risks, benefits, and limitations, BRUNETTA NEWINGHAM provided informed consent to pursue genetic testing and the blood sample will be sent to [facility] for analysis of the Breast/ovarian cancer panel. We discussed the implications of a positive, negative and/ or variant of uncertain significance genetic test result. Results should be available within approximately 3-4 weeks' time, at which point they will be disclosed by telephone to Jillian Hunt, as will any additional recommendations warranted by these results. Jillian Hunt will receive a summary of her genetic counseling visit and a copy of her results once available. This information will also be available in Epic. We encouraged Quanesha Klimaszewski Hunt to remain in contact with cancer genetics annually so that we can continuously update the family history and inform her of any changes in cancer genetics and testing that may be of benefit for her family. Jillian Hunt's questions were answered to her satisfaction today. Our contact information was provided should additional questions or concerns arise.  The patient was seen for a total of 60 minutes, greater than 50% of which was spent face-to-face counseling.  This plan is being carried out per Dr. Feliz Beam recommendations.  This note will also be sent to the referring provider via the electronic medical record. The patient will be supplied with a summary of this genetic counseling discussion as well as educational information on the discussed hereditary cancer syndromes following the conclusion of their visit.   Patient was discussed with Dr. Drue Second.   _______________________________________________________________________ For Office Staff:  Number of people involved in session: 1 Was an  Intern/ student involved with case: no

## 2013-04-08 ENCOUNTER — Encounter: Payer: Self-pay | Admitting: *Deleted

## 2013-04-08 NOTE — Progress Notes (Signed)
Per Dr. Darnelle Catalan received order for Oncotype.  Requisition sent to pathology.  Spoke to Tammy to ensure received onctype order.  PAC sent to [facility].

## 2013-04-10 ENCOUNTER — Telehealth: Payer: Self-pay | Admitting: *Deleted

## 2013-04-10 NOTE — Telephone Encounter (Signed)
Lm gv appt for 05/05/13 w/ labs @ 3:30pm and ov @ 4pm....td

## 2013-04-14 ENCOUNTER — Other Ambulatory Visit (INDEPENDENT_AMBULATORY_CARE_PROVIDER_SITE_OTHER): Payer: Self-pay

## 2013-04-14 ENCOUNTER — Ambulatory Visit: Payer: BC Managed Care – PPO | Admitting: Oncology

## 2013-04-14 ENCOUNTER — Other Ambulatory Visit: Payer: BC Managed Care – PPO | Admitting: Lab

## 2013-04-14 ENCOUNTER — Telehealth: Payer: Self-pay | Admitting: *Deleted

## 2013-04-14 NOTE — Telephone Encounter (Signed)
Patient called in today requesting records and radiology imaging for consult at [facility] tomorrow at 12noon. Called Kim in [employer] here at [facility], she will gather notes from Dr Princella Pellegrini visits and call patient when ready. Called [facility] and they will make CD's for pick up for tomorrow morning.

## 2013-04-15 ENCOUNTER — Encounter: Payer: Self-pay | Admitting: *Deleted

## 2013-04-15 NOTE — Progress Notes (Signed)
Received Oncotype Dx results of 21.  Emailed MD w/ results.  Gave MD copy & Took copy to Med Rec to scan.

## 2013-04-15 NOTE — Progress Notes (Unsigned)
Received oncotype score of 21.  Copy to Dr. Darnelle Catalan and copy to Medical records to scan.  Emailed Dr. Darnelle Catalan with results.

## 2013-04-16 ENCOUNTER — Other Ambulatory Visit: Payer: Self-pay | Admitting: Oncology

## 2013-04-16 NOTE — Progress Notes (Signed)
Patient ID: Jillian Hunt, female   DOB: 04/03/1966, 47 y.o.   MRN: [id number]  Dr. Amie Critchley called me regarding the patient's visit at [facility]. Dr. Talbert Nan opinion was that the patient should proceed to mastectomy because (a) a mastectomy was inevitable and (b) it would be important to have accurate lymph node information to plan appropriate chemotherapy.  The patient apparently was already seen by surgery and plastics at [facility]. However it is not clear whether the patient has made a definitive decision at present.  My own feeling is that Dr. Talbert Nan concern regarding the lymph nodes is very appropriate. We can address that however with a sentinel lymph node sampling done now. We can continue the neoadjuvant treatment for several months to see if we can lead to a safe breast conservation option.  I will make Dr. Ezzard Standing aware of these developments in case the patient contacts him. Otherwise she is a ready scheduled to see me September 30.

## 2013-04-17 ENCOUNTER — Telehealth: Payer: Self-pay | Admitting: *Deleted

## 2013-04-17 NOTE — Telephone Encounter (Signed)
Spoke to pt.  She has decided to have treatment at [facility].  Informed pt to please call me if she has needs or concerns.  Received verbal understanding.  Contact information given.

## 2013-04-21 ENCOUNTER — Telehealth: Payer: Self-pay | Admitting: *Deleted

## 2013-04-21 NOTE — Telephone Encounter (Signed)
Pt called for genetic results to be sent to Dr. Amie Critchley at [facility].  Informed pt that results have not been received.  This RN emailed Clydie Braun and informed of Dr. Amie Critchley request.  Pt knows we will send results as soon as we receive them.

## 2013-04-22 ENCOUNTER — Other Ambulatory Visit: Payer: Self-pay | Admitting: Oncology

## 2013-04-23 ENCOUNTER — Other Ambulatory Visit: Payer: Self-pay | Admitting: Oncology

## 2013-04-27 ENCOUNTER — Telehealth: Payer: Self-pay | Admitting: Genetic Counselor

## 2013-04-27 NOTE — Telephone Encounter (Signed)
Revealed negative genetic testing.  She has transferred her care to [facility].  She would like her test results to go to Dr. Amie Critchley at [facility]. SHe has left all of that information with Marianne Sofia.

## 2013-04-28 ENCOUNTER — Encounter: Payer: Self-pay | Admitting: Genetic Counselor

## 2013-05-02 ENCOUNTER — Other Ambulatory Visit: Payer: Self-pay | Admitting: Oncology

## 2013-05-04 ENCOUNTER — Encounter (INDEPENDENT_AMBULATORY_CARE_PROVIDER_SITE_OTHER): Payer: Self-pay

## 2013-05-05 ENCOUNTER — Other Ambulatory Visit: Payer: BC Managed Care – PPO | Admitting: Lab

## 2013-05-05 ENCOUNTER — Ambulatory Visit: Payer: BC Managed Care – PPO | Admitting: Oncology

## 2013-05-08 ENCOUNTER — Ambulatory Visit (INDEPENDENT_AMBULATORY_CARE_PROVIDER_SITE_OTHER): Payer: Self-pay | Admitting: Surgery

## 2013-05-11 ENCOUNTER — Encounter (INDEPENDENT_AMBULATORY_CARE_PROVIDER_SITE_OTHER): Payer: Self-pay

## 2016-07-18 ENCOUNTER — Other Ambulatory Visit: Payer: Self-pay | Admitting: Family Medicine

## 2016-07-18 DIAGNOSIS — M79605 Pain in left leg: Secondary | ICD-10-CM

## 2016-08-13 DIAGNOSIS — Z006 Encounter for examination for normal comparison and control in clinical research program: Secondary | ICD-10-CM | POA: Diagnosis not present

## 2016-08-13 DIAGNOSIS — C50911 Malignant neoplasm of unspecified site of right female breast: Secondary | ICD-10-CM | POA: Diagnosis not present

## 2016-08-17 ENCOUNTER — Other Ambulatory Visit: Payer: Self-pay

## 2016-08-24 DIAGNOSIS — M25551 Pain in right hip: Secondary | ICD-10-CM | POA: Diagnosis not present

## 2016-08-24 DIAGNOSIS — Z006 Encounter for examination for normal comparison and control in clinical research program: Secondary | ICD-10-CM | POA: Diagnosis not present

## 2016-08-24 DIAGNOSIS — C50919 Malignant neoplasm of unspecified site of unspecified female breast: Secondary | ICD-10-CM | POA: Diagnosis not present

## 2016-08-27 DIAGNOSIS — C50911 Malignant neoplasm of unspecified site of right female breast: Secondary | ICD-10-CM | POA: Diagnosis not present

## 2016-08-27 DIAGNOSIS — Z006 Encounter for examination for normal comparison and control in clinical research program: Secondary | ICD-10-CM | POA: Diagnosis not present

## 2016-08-29 DIAGNOSIS — M25551 Pain in right hip: Secondary | ICD-10-CM | POA: Diagnosis not present

## 2016-08-29 DIAGNOSIS — M25561 Pain in right knee: Secondary | ICD-10-CM | POA: Diagnosis not present

## 2016-08-29 DIAGNOSIS — M545 Low back pain: Secondary | ICD-10-CM | POA: Diagnosis not present

## 2016-09-06 DIAGNOSIS — M545 Low back pain: Secondary | ICD-10-CM | POA: Diagnosis not present

## 2016-09-06 DIAGNOSIS — M25561 Pain in right knee: Secondary | ICD-10-CM | POA: Diagnosis not present

## 2016-09-06 DIAGNOSIS — M25551 Pain in right hip: Secondary | ICD-10-CM | POA: Diagnosis not present

## 2016-09-24 DIAGNOSIS — Z006 Encounter for examination for normal comparison and control in clinical research program: Secondary | ICD-10-CM | POA: Diagnosis not present

## 2016-09-24 DIAGNOSIS — C50911 Malignant neoplasm of unspecified site of right female breast: Secondary | ICD-10-CM | POA: Diagnosis not present

## 2016-10-22 DIAGNOSIS — E079 Disorder of thyroid, unspecified: Secondary | ICD-10-CM | POA: Diagnosis not present

## 2016-10-22 DIAGNOSIS — C50911 Malignant neoplasm of unspecified site of right female breast: Secondary | ICD-10-CM | POA: Diagnosis not present

## 2016-10-22 DIAGNOSIS — Z006 Encounter for examination for normal comparison and control in clinical research program: Secondary | ICD-10-CM | POA: Diagnosis not present

## 2016-11-16 DIAGNOSIS — C50911 Malignant neoplasm of unspecified site of right female breast: Secondary | ICD-10-CM | POA: Diagnosis not present

## 2016-11-16 DIAGNOSIS — Z006 Encounter for examination for normal comparison and control in clinical research program: Secondary | ICD-10-CM | POA: Diagnosis not present

## 2016-11-16 DIAGNOSIS — R911 Solitary pulmonary nodule: Secondary | ICD-10-CM | POA: Diagnosis not present

## 2016-11-19 DIAGNOSIS — M25551 Pain in right hip: Secondary | ICD-10-CM | POA: Diagnosis not present

## 2016-11-19 DIAGNOSIS — Z006 Encounter for examination for normal comparison and control in clinical research program: Secondary | ICD-10-CM | POA: Diagnosis not present

## 2016-11-19 DIAGNOSIS — C50911 Malignant neoplasm of unspecified site of right female breast: Secondary | ICD-10-CM | POA: Diagnosis not present

## 2016-12-03 DIAGNOSIS — M25561 Pain in right knee: Secondary | ICD-10-CM | POA: Diagnosis not present

## 2016-12-03 DIAGNOSIS — G8929 Other chronic pain: Secondary | ICD-10-CM | POA: Diagnosis not present

## 2016-12-17 DIAGNOSIS — M25551 Pain in right hip: Secondary | ICD-10-CM | POA: Diagnosis not present

## 2016-12-17 DIAGNOSIS — Z006 Encounter for examination for normal comparison and control in clinical research program: Secondary | ICD-10-CM | POA: Diagnosis not present

## 2016-12-17 DIAGNOSIS — C50911 Malignant neoplasm of unspecified site of right female breast: Secondary | ICD-10-CM | POA: Diagnosis not present

## 2016-12-17 DIAGNOSIS — C50411 Malignant neoplasm of upper-outer quadrant of right female breast: Secondary | ICD-10-CM | POA: Diagnosis not present

## 2016-12-27 DIAGNOSIS — E039 Hypothyroidism, unspecified: Secondary | ICD-10-CM | POA: Diagnosis not present

## 2017-01-14 DIAGNOSIS — Z006 Encounter for examination for normal comparison and control in clinical research program: Secondary | ICD-10-CM | POA: Diagnosis not present

## 2017-01-14 DIAGNOSIS — E079 Disorder of thyroid, unspecified: Secondary | ICD-10-CM | POA: Diagnosis not present

## 2017-01-14 DIAGNOSIS — C50911 Malignant neoplasm of unspecified site of right female breast: Secondary | ICD-10-CM | POA: Diagnosis not present

## 2017-01-14 DIAGNOSIS — C50411 Malignant neoplasm of upper-outer quadrant of right female breast: Secondary | ICD-10-CM | POA: Diagnosis not present

## 2017-01-22 DIAGNOSIS — M545 Low back pain: Secondary | ICD-10-CM | POA: Diagnosis not present

## 2017-01-22 DIAGNOSIS — M25551 Pain in right hip: Secondary | ICD-10-CM | POA: Diagnosis not present

## 2017-01-22 DIAGNOSIS — M25561 Pain in right knee: Secondary | ICD-10-CM | POA: Diagnosis not present

## 2017-01-24 DIAGNOSIS — M25561 Pain in right knee: Secondary | ICD-10-CM | POA: Diagnosis not present

## 2017-01-24 DIAGNOSIS — M25551 Pain in right hip: Secondary | ICD-10-CM | POA: Diagnosis not present

## 2017-01-24 DIAGNOSIS — M545 Low back pain: Secondary | ICD-10-CM | POA: Diagnosis not present

## 2017-01-28 DIAGNOSIS — M25551 Pain in right hip: Secondary | ICD-10-CM | POA: Diagnosis not present

## 2017-01-28 DIAGNOSIS — M545 Low back pain: Secondary | ICD-10-CM | POA: Diagnosis not present

## 2017-01-28 DIAGNOSIS — M25561 Pain in right knee: Secondary | ICD-10-CM | POA: Diagnosis not present

## 2017-02-07 DIAGNOSIS — M25551 Pain in right hip: Secondary | ICD-10-CM | POA: Diagnosis not present

## 2017-02-07 DIAGNOSIS — M25561 Pain in right knee: Secondary | ICD-10-CM | POA: Diagnosis not present

## 2017-02-07 DIAGNOSIS — M545 Low back pain: Secondary | ICD-10-CM | POA: Diagnosis not present

## 2017-02-08 DIAGNOSIS — R911 Solitary pulmonary nodule: Secondary | ICD-10-CM | POA: Diagnosis not present

## 2017-02-08 DIAGNOSIS — C50911 Malignant neoplasm of unspecified site of right female breast: Secondary | ICD-10-CM | POA: Diagnosis not present

## 2017-02-08 DIAGNOSIS — Z006 Encounter for examination for normal comparison and control in clinical research program: Secondary | ICD-10-CM | POA: Diagnosis not present

## 2017-02-11 DIAGNOSIS — Z006 Encounter for examination for normal comparison and control in clinical research program: Secondary | ICD-10-CM | POA: Diagnosis not present

## 2017-02-11 DIAGNOSIS — M25551 Pain in right hip: Secondary | ICD-10-CM | POA: Diagnosis not present

## 2017-02-11 DIAGNOSIS — C50911 Malignant neoplasm of unspecified site of right female breast: Secondary | ICD-10-CM | POA: Diagnosis not present

## 2017-02-14 DIAGNOSIS — C50911 Malignant neoplasm of unspecified site of right female breast: Secondary | ICD-10-CM | POA: Diagnosis not present

## 2017-02-14 DIAGNOSIS — Z006 Encounter for examination for normal comparison and control in clinical research program: Secondary | ICD-10-CM | POA: Diagnosis not present

## 2017-02-25 DIAGNOSIS — Z006 Encounter for examination for normal comparison and control in clinical research program: Secondary | ICD-10-CM | POA: Diagnosis not present

## 2017-02-25 DIAGNOSIS — C50911 Malignant neoplasm of unspecified site of right female breast: Secondary | ICD-10-CM | POA: Diagnosis not present

## 2017-03-07 DIAGNOSIS — M25561 Pain in right knee: Secondary | ICD-10-CM | POA: Diagnosis not present

## 2017-03-07 DIAGNOSIS — M25551 Pain in right hip: Secondary | ICD-10-CM | POA: Diagnosis not present

## 2017-03-07 DIAGNOSIS — M545 Low back pain: Secondary | ICD-10-CM | POA: Diagnosis not present

## 2017-03-08 DIAGNOSIS — M25561 Pain in right knee: Secondary | ICD-10-CM | POA: Diagnosis not present

## 2017-03-08 DIAGNOSIS — C50411 Malignant neoplasm of upper-outer quadrant of right female breast: Secondary | ICD-10-CM | POA: Diagnosis not present

## 2017-03-08 DIAGNOSIS — Z006 Encounter for examination for normal comparison and control in clinical research program: Secondary | ICD-10-CM | POA: Diagnosis not present

## 2017-03-08 DIAGNOSIS — C50911 Malignant neoplasm of unspecified site of right female breast: Secondary | ICD-10-CM | POA: Diagnosis not present

## 2017-04-09 DIAGNOSIS — C50911 Malignant neoplasm of unspecified site of right female breast: Secondary | ICD-10-CM | POA: Diagnosis not present

## 2017-04-09 DIAGNOSIS — C50411 Malignant neoplasm of upper-outer quadrant of right female breast: Secondary | ICD-10-CM | POA: Diagnosis not present

## 2017-04-09 DIAGNOSIS — Z006 Encounter for examination for normal comparison and control in clinical research program: Secondary | ICD-10-CM | POA: Diagnosis not present

## 2017-04-09 DIAGNOSIS — C50912 Malignant neoplasm of unspecified site of left female breast: Secondary | ICD-10-CM | POA: Diagnosis not present

## 2017-04-10 DIAGNOSIS — M238X1 Other internal derangements of right knee: Secondary | ICD-10-CM | POA: Diagnosis not present

## 2017-04-10 DIAGNOSIS — M5441 Lumbago with sciatica, right side: Secondary | ICD-10-CM | POA: Diagnosis not present

## 2017-04-18 DIAGNOSIS — M238X1 Other internal derangements of right knee: Secondary | ICD-10-CM | POA: Diagnosis not present

## 2017-04-26 DIAGNOSIS — M238X1 Other internal derangements of right knee: Secondary | ICD-10-CM | POA: Diagnosis not present

## 2017-05-06 DIAGNOSIS — M25561 Pain in right knee: Secondary | ICD-10-CM | POA: Diagnosis not present

## 2017-05-06 DIAGNOSIS — Z006 Encounter for examination for normal comparison and control in clinical research program: Secondary | ICD-10-CM | POA: Diagnosis not present

## 2017-05-06 DIAGNOSIS — C50912 Malignant neoplasm of unspecified site of left female breast: Secondary | ICD-10-CM | POA: Diagnosis not present

## 2017-05-06 DIAGNOSIS — C50411 Malignant neoplasm of upper-outer quadrant of right female breast: Secondary | ICD-10-CM | POA: Diagnosis not present

## 2017-05-06 DIAGNOSIS — C50911 Malignant neoplasm of unspecified site of right female breast: Secondary | ICD-10-CM | POA: Diagnosis not present

## 2017-05-25 DIAGNOSIS — M5441 Lumbago with sciatica, right side: Secondary | ICD-10-CM | POA: Diagnosis not present

## 2017-05-25 DIAGNOSIS — M238X1 Other internal derangements of right knee: Secondary | ICD-10-CM | POA: Diagnosis not present

## 2017-06-03 DIAGNOSIS — R911 Solitary pulmonary nodule: Secondary | ICD-10-CM | POA: Diagnosis not present

## 2017-06-03 DIAGNOSIS — C50912 Malignant neoplasm of unspecified site of left female breast: Secondary | ICD-10-CM | POA: Diagnosis not present

## 2017-06-03 DIAGNOSIS — C50911 Malignant neoplasm of unspecified site of right female breast: Secondary | ICD-10-CM | POA: Diagnosis not present

## 2017-06-03 DIAGNOSIS — C7951 Secondary malignant neoplasm of bone: Secondary | ICD-10-CM | POA: Diagnosis not present

## 2017-06-03 DIAGNOSIS — Z006 Encounter for examination for normal comparison and control in clinical research program: Secondary | ICD-10-CM | POA: Diagnosis not present

## 2017-06-03 DIAGNOSIS — C50411 Malignant neoplasm of upper-outer quadrant of right female breast: Secondary | ICD-10-CM | POA: Diagnosis not present

## 2017-06-04 DIAGNOSIS — M5441 Lumbago with sciatica, right side: Secondary | ICD-10-CM | POA: Diagnosis not present

## 2017-06-04 DIAGNOSIS — M238X1 Other internal derangements of right knee: Secondary | ICD-10-CM | POA: Diagnosis not present

## 2017-06-13 DIAGNOSIS — M5441 Lumbago with sciatica, right side: Secondary | ICD-10-CM | POA: Diagnosis not present

## 2017-06-21 DIAGNOSIS — M5441 Lumbago with sciatica, right side: Secondary | ICD-10-CM | POA: Diagnosis not present

## 2017-07-01 DIAGNOSIS — C50911 Malignant neoplasm of unspecified site of right female breast: Secondary | ICD-10-CM | POA: Diagnosis not present

## 2017-07-01 DIAGNOSIS — C44319 Basal cell carcinoma of skin of other parts of face: Secondary | ICD-10-CM | POA: Diagnosis not present

## 2017-07-01 DIAGNOSIS — C50411 Malignant neoplasm of upper-outer quadrant of right female breast: Secondary | ICD-10-CM | POA: Diagnosis not present

## 2017-07-01 DIAGNOSIS — Z006 Encounter for examination for normal comparison and control in clinical research program: Secondary | ICD-10-CM | POA: Diagnosis not present

## 2017-07-01 DIAGNOSIS — E079 Disorder of thyroid, unspecified: Secondary | ICD-10-CM | POA: Diagnosis not present

## 2017-07-25 DIAGNOSIS — Z006 Encounter for examination for normal comparison and control in clinical research program: Secondary | ICD-10-CM | POA: Diagnosis not present

## 2017-07-25 DIAGNOSIS — C50411 Malignant neoplasm of upper-outer quadrant of right female breast: Secondary | ICD-10-CM | POA: Diagnosis not present

## 2017-07-25 DIAGNOSIS — E079 Disorder of thyroid, unspecified: Secondary | ICD-10-CM | POA: Diagnosis not present

## 2017-07-25 DIAGNOSIS — C50912 Malignant neoplasm of unspecified site of left female breast: Secondary | ICD-10-CM | POA: Diagnosis not present

## 2017-07-25 DIAGNOSIS — C50911 Malignant neoplasm of unspecified site of right female breast: Secondary | ICD-10-CM | POA: Diagnosis not present

## 2017-08-19 DIAGNOSIS — Z23 Encounter for immunization: Secondary | ICD-10-CM | POA: Diagnosis not present

## 2017-08-19 DIAGNOSIS — Z853 Personal history of malignant neoplasm of breast: Secondary | ICD-10-CM | POA: Diagnosis not present

## 2017-08-19 DIAGNOSIS — E559 Vitamin D deficiency, unspecified: Secondary | ICD-10-CM | POA: Diagnosis not present

## 2017-08-19 DIAGNOSIS — E039 Hypothyroidism, unspecified: Secondary | ICD-10-CM | POA: Diagnosis not present

## 2017-08-19 DIAGNOSIS — E538 Deficiency of other specified B group vitamins: Secondary | ICD-10-CM | POA: Diagnosis not present

## 2017-08-22 DIAGNOSIS — C50912 Malignant neoplasm of unspecified site of left female breast: Secondary | ICD-10-CM | POA: Diagnosis not present

## 2017-08-22 DIAGNOSIS — C50911 Malignant neoplasm of unspecified site of right female breast: Secondary | ICD-10-CM | POA: Diagnosis not present

## 2017-08-22 DIAGNOSIS — Z170 Estrogen receptor positive status [ER+]: Secondary | ICD-10-CM | POA: Diagnosis not present

## 2017-08-22 DIAGNOSIS — Z006 Encounter for examination for normal comparison and control in clinical research program: Secondary | ICD-10-CM | POA: Diagnosis not present

## 2017-08-23 DIAGNOSIS — M25561 Pain in right knee: Secondary | ICD-10-CM | POA: Diagnosis not present

## 2017-09-09 DIAGNOSIS — M25561 Pain in right knee: Secondary | ICD-10-CM | POA: Diagnosis not present

## 2017-09-19 DIAGNOSIS — Z006 Encounter for examination for normal comparison and control in clinical research program: Secondary | ICD-10-CM | POA: Diagnosis not present

## 2017-09-19 DIAGNOSIS — C50411 Malignant neoplasm of upper-outer quadrant of right female breast: Secondary | ICD-10-CM | POA: Diagnosis not present

## 2017-09-19 DIAGNOSIS — C50912 Malignant neoplasm of unspecified site of left female breast: Secondary | ICD-10-CM | POA: Diagnosis not present

## 2017-09-19 DIAGNOSIS — E079 Disorder of thyroid, unspecified: Secondary | ICD-10-CM | POA: Diagnosis not present

## 2017-09-19 DIAGNOSIS — Z0489 Encounter for examination and observation for other specified reasons: Secondary | ICD-10-CM | POA: Diagnosis not present

## 2017-09-19 DIAGNOSIS — C50911 Malignant neoplasm of unspecified site of right female breast: Secondary | ICD-10-CM | POA: Diagnosis not present

## 2017-09-24 DIAGNOSIS — G8929 Other chronic pain: Secondary | ICD-10-CM | POA: Diagnosis not present

## 2017-09-24 DIAGNOSIS — M79672 Pain in left foot: Secondary | ICD-10-CM | POA: Diagnosis not present

## 2019-09-01 ENCOUNTER — Ambulatory Visit: Payer: Self-pay

## 2019-09-22 ENCOUNTER — Ambulatory Visit: Payer: Self-pay

## 2020-05-13 ENCOUNTER — Other Ambulatory Visit: Payer: Self-pay | Admitting: Family Medicine

## 2020-05-13 DIAGNOSIS — E2839 Other primary ovarian failure: Secondary | ICD-10-CM

## 2020-09-06 ENCOUNTER — Ambulatory Visit
Admission: RE | Admit: 2020-09-06 | Discharge: 2020-09-06 | Disposition: A | Discharge status: Home / Self Care | Payer: 59 | Source: Ambulatory Visit | Attending: Family Medicine | Admitting: Family Medicine

## 2020-09-06 ENCOUNTER — Other Ambulatory Visit: Payer: Self-pay

## 2020-09-06 DIAGNOSIS — E2839 Other primary ovarian failure: Secondary | ICD-10-CM

## 2020-09-19 ENCOUNTER — Other Ambulatory Visit: Payer: Self-pay | Admitting: Internal Medicine

## 2022-11-14 ENCOUNTER — Other Ambulatory Visit: Payer: Self-pay | Admitting: Family Medicine

## 2022-11-14 DIAGNOSIS — M8580 Other specified disorders of bone density and structure, unspecified site: Secondary | ICD-10-CM

## 2023-04-02 ENCOUNTER — Encounter: Payer: Self-pay | Admitting: Oncology

## 2023-04-09 ENCOUNTER — Ambulatory Visit: Payer: Managed Care, Other (non HMO) | Admitting: Obstetrics

## 2023-04-09 ENCOUNTER — Encounter: Payer: Self-pay | Admitting: Obstetrics

## 2023-04-09 ENCOUNTER — Encounter: Payer: Self-pay | Admitting: Oncology

## 2023-04-09 ENCOUNTER — Ambulatory Visit: Payer: 59 | Admitting: Obstetrics and Gynecology

## 2023-04-09 VITALS — BP 145/88 | HR 79 | Ht 65.75 in | Wt 172.0 lb

## 2023-04-09 DIAGNOSIS — R350 Frequency of micturition: Secondary | ICD-10-CM | POA: Diagnosis not present

## 2023-04-09 DIAGNOSIS — N8110 Cystocele, unspecified: Secondary | ICD-10-CM | POA: Diagnosis not present

## 2023-04-09 DIAGNOSIS — N3946 Mixed incontinence: Secondary | ICD-10-CM | POA: Diagnosis not present

## 2023-04-09 LAB — POCT URINALYSIS DIPSTICK
Bilirubin, UA: NEGATIVE
Blood, UA: NEGATIVE
Glucose, UA: NEGATIVE
Ketones, UA: NEGATIVE
Leukocytes, UA: NEGATIVE
Nitrite, UA: NEGATIVE
Protein, UA: NEGATIVE
Spec Grav, UA: 1.015 (ref 1.010–1.025)
Urobilinogen, UA: 0.2 U/dL
pH, UA: 6 (ref 5.0–8.0)

## 2023-04-09 NOTE — Progress Notes (Signed)
[facility] New Patient Evaluation and Consultation  Referring Provider: Gweneth Dimitri, MD PCP: Gweneth Dimitri, MD Date of Service: 04/09/2023  SUBJECTIVE Chief Complaint: New Patient (Initial Visit) Jillian Hunt is a 57 y.o. female here for a prolapse consult. Pt said she has urinary urgency)  History of Present Illness: Jillian Hunt is a 57 y.o. White or Caucasian female seen in consultation at the request of Dr. Corliss Blacker for evaluation of bladder prolapse.    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  Nocturia: 1 times per night to void. Voiding dysfunction: she empties her bladder well.  does not use a catheter to empty bladder.  When urinating, she feels dribbling after finishing Drinks: 24oz per day  UTIs:  0  UTI's in the last year.   Denies history of blood in urine, kidney or bladder stones, pyelonephritis, bladder cancer, and kidney cancer  Pelvic Organ Prolapse Symptoms:                  She Admits to a feeling of a bulge the vaginal area. It has been present for 5 years.  She Admits to seeing an egg size bulge.  This bulge is not bothersome.  Bowel Symptom: Bowel movements: 2 time(s) per day Stool consistency: soft  Straining: no.  Splinting: no.  Incomplete evacuation: no.  She Denies accidental bowel leakage / fecal incontinence Bowel regimen: none Last colonoscopy: Date 2020 normal per patient  Sexual  Function Sexually active: no, underwent divorce at time of breast cancer diagnosis Sexual orientation: Straight  Pelvic Pain Denies pelvic pain  Past Medical History:  Past Medical History:  Diagnosis Date   Basal cell carcinoma of cheek    Breast cancer (HCC) 03/20/2013   right breast   Hashimoto's disease    Thyroid disease      Past Surgical History:   Past Surgical History:  Procedure Laterality Date   TOTAL LAPAROSCOPIC HYSTERECTOMY WITH BILATERAL SALPINGO OOPHORECTOMY Bilateral    2014 for entry of breast cancer clinical trial     Past OB/GYN History: G2 P2 Vaginal deliveries: 2 Forceps/ Vacuum deliveries: none Largest infant 10lb 6oz with vaginal laceration repair Menopausal: Yes, at age 75. Surgical menopause with TLH and BSO for breast cancer treatment clinical trial at [facility]  Last pap smear was 03/2013 with possible HPV per documentation.  Any history of abnormal pap smears: cryotherapy prior to 2014. Last OBGYN visit was with Dr. Cherly Hensen around 2023. S/p hysterectomy   Medications: She has a current medication list which includes the following prescription(s): letrozole, levothyroxine, and ribociclib succinate.   Allergies: Patient is allergic to sulfa antibiotics.   Social History:  Social History   Tobacco Use   Smoking status: Former    Current packs/day: 0.00    Average packs/day: 0.2 packs/day for 4.0 years (0.6 ttl pk-yrs)    Types: Cigarettes    Start date: 08/06/1984    Quit date: 08/06/1988    Years since  quitting: 34.6   Smokeless tobacco: Never   Tobacco comments:    socially in college  Vaping Use   Vaping status: Never Used  Substance Use Topics   Alcohol use: Yes    Alcohol/week: 1.0 standard drink of alcohol    Types: 1 Glasses of wine per week    Comment: 1 per day   Drug use: No    Relationship status: divorced She lives by herself.   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 Regular exercise: Yes: yoga twice a day and  weights History of abuse: No  Family History:   Family History  Problem Relation Age of Onset   Stomach cancer Maternal Aunt        dx in her late 58s   Cancer Paternal Aunt        stomach   Cancer Paternal Aunt        lymph node in neck   Skin cancer Father        either BCC or SCC   Cancer Maternal Aunt        started in enlarged lymph node   Healthy Daughter    Healthy Son      Review of Systems: Review of Systems  Constitutional: Negative.   Respiratory: Negative.    Cardiovascular: Negative.   Genitourinary: Negative.        Leakage, bulge     OBJECTIVE Physical Exam: Vitals:   04/09/23 1421  BP: (!) 145/88  Pulse: 79  Weight: 172 lb (78 kg)  Height: 5' 5.75" (1.67 m)    Physical Exam Vitals reviewed. Exam conducted with a chaperone present.  Constitutional:      General: She is not in acute distress. Cardiovascular:     Rate and Rhythm: Normal rate.  Pulmonary:     Effort: Pulmonary effort is normal. No respiratory distress.  Genitourinary:    Labia:        Right: No rash, tenderness, lesion or injury.        Left: No rash, tenderness, lesion or injury.      Urethra: No prolapse, urethral pain, urethral swelling or urethral lesion.     Vagina: No signs of injury and foreign body. Prolapsed vaginal walls present. No vaginal discharge, erythema, tenderness, bleeding or lesions.     Uterus: Absent.     Neurological:     Mental Status: She is alert.   GU / Detailed Urogynecologic Evaluation:   CST: negative  Speculum exam s/p hysterectomy: Speculum exam reveals normal vaginal mucosa with atrophy and normal vaginal cuff.     Pelvic floor musculature: Right levator non-tender, Right obturator non-tender, Left levator non-tender, Left obturator non-tender  POP-Q:   POP-Q  1                                            Aa   1                                           Ba  -3  C   4                                             Gh  2                                            Pb  6                                            tvl   -1                                            Ap  -1                                            Bp                                                 D    Post-Void Residual (PVR) by Bladder Scan: In order to evaluate bladder emptying, we discussed obtaining a postvoid residual and she agreed to this procedure.  Procedure: The ultrasound unit was placed on the patient's abdomen in the suprapubic region after the patient had voided. A PVR of 4 ml was obtained by bladder scan.  Laboratory Results: I visualized the urine specimen, noting the specimen to be clear yellow with negative leuk/heme/protein/nit  ASSESSMENT AND PLAN Ms. Fritchman is a 58 y.o. with:  1. Urinary frequency   2. Pelvic organ prolapse quantification stage 2 cystocele   3. Mixed stress and urge urinary incontinence    Urinary frequency - preemptively increased daytime voids to reduce leakage at work - reviewed bladder training and Kegel exercises  Stage II Pelvic Organ prolapse  - For treatment of pelvic organ prolapse, we discussed options for management including expectant management, conservative management, and surgical management, such as Kegels, a pessary, pelvic floor physical therapy, and specific surgical procedures. - pt desires expectant management due to minimal bother, desires to avoid surgical treatment.  Mixed urinary incontinence - stress urinary incontinence more bothersome with low volume leakage at work 0-1x/day - For treatment of stress urinary incontinence,  non-surgical options include expectant management, weight loss, physical therapy, as well as a pessary.  Surgical options include a midurethral sling, Burch urethropexy, and transurethral injection of a bulking agent. - We discussed the symptoms of overactive bladder (OAB), which include urinary urgency, urinary  frequency, nocturia, with or without urge incontinence.  While we do not know the exact etiology of OAB, several treatment options exist. We discussed management including behavioral therapy (decreasing bladder irritants, urge suppression strategies, timed voids, bladder retraining), physical therapy, medication; for refractory cases posterior tibial nerve stimulation, sacral neuromodulation, and intravesical botulinum toxin injection.  For anticholinergic medications, we discussed the  potential side effects of anticholinergics including dry eyes, dry mouth, constipation, cognitive impairment and urinary retention. For Beta-3 agonist medication, we discussed the potential side effect of elevated blood pressure which is more likely to occur in individuals with uncontrolled hypertension. - patient desires expectant management at this time due to minimal bother, encouraged pt to return to further discuss treatment options if clinical change  Time spent: I spent 39 minutes dedicated to the care of this patient on the date of this encounter to include pre-visit review of records, face-to-face time with the patient discussing her diagnoses, treatment options and post visit documentation.   Follow-up as needed.  Loleta Chance, MD

## 2023-04-09 NOTE — Patient Instructions (Signed)
You have a stage 2 (out of 4) prolapse.  We discussed the fact that it is not life threatening but there are several treatment options. For treatment of pelvic organ prolapse, we discussed options for management including expectant management, conservative management, and surgical management, such as Kegels, a pessary, pelvic floor physical therapy, and specific surgical procedures.    Mixed Incontinence (MUI):  MUI includes symptoms of both Overactive bladder (OAB) and stress incontinence (SUI).    Overactive bladder (OAB) causes bladder urgency, frequency, and having to void at night with or without leakage.  Several  treatment options exist, including behavioral changes (avoiding caffeine, etc), physical therapy, medications, and neuromodulation (ways to change the nerve signals to the bladder).   Stress incontinence (SUI) causes urinary leakage with coughing, laughing, sneezing and occasionally during exercise or bending/lifting.  Treatment options include nonsurgical options such as Kegel (pelvic floor) exercises, physical therapy, pessary (vaginal device similar to a diaphragm to prevent leakage) or surgical options such as a midurethral sling.

## 2023-05-17 ENCOUNTER — Other Ambulatory Visit (HOSPITAL_BASED_OUTPATIENT_CLINIC_OR_DEPARTMENT_OTHER): Payer: Self-pay

## 2023-05-17 ENCOUNTER — Encounter: Payer: Self-pay | Admitting: Oncology

## 2023-05-17 MED ORDER — COVID-19 MRNA VAC-TRIS(PFIZER) 30 MCG/0.3ML IM SUSY
0.3000 mL | PREFILLED_SYRINGE | Freq: Once | INTRAMUSCULAR | 0 refills | Status: AC
  Filled 2023-05-17: qty 0.3, 1d supply, fill #0

## 2023-05-17 MED ORDER — INFLUENZA VIRUS VACC SPLIT PF (FLUZONE) 0.5 ML IM SUSY
0.5000 mL | PREFILLED_SYRINGE | Freq: Once | INTRAMUSCULAR | 0 refills | Status: AC
  Filled 2023-05-17: qty 0.5, 1d supply, fill #0

## 2023-06-04 ENCOUNTER — Inpatient Hospital Stay
Admission: RE | Admit: 2023-06-04 | Discharge: 2023-06-04 | Disposition: A | Discharge status: Home / Self Care | Payer: 59 | Source: Ambulatory Visit | Attending: Family Medicine | Admitting: Family Medicine

## 2023-06-04 DIAGNOSIS — M8580 Other specified disorders of bone density and structure, unspecified site: Secondary | ICD-10-CM

## 2023-11-13 DIAGNOSIS — Z1331 Encounter for screening for depression: Secondary | ICD-10-CM | POA: Diagnosis not present

## 2023-11-13 DIAGNOSIS — Z860100 Personal history of colon polyps, unspecified: Secondary | ICD-10-CM | POA: Diagnosis not present

## 2023-11-13 DIAGNOSIS — C50911 Malignant neoplasm of unspecified site of right female breast: Secondary | ICD-10-CM | POA: Diagnosis not present

## 2023-11-13 DIAGNOSIS — E559 Vitamin D deficiency, unspecified: Secondary | ICD-10-CM | POA: Diagnosis not present

## 2023-11-13 DIAGNOSIS — K130 Diseases of lips: Secondary | ICD-10-CM | POA: Diagnosis not present

## 2023-11-13 DIAGNOSIS — Z23 Encounter for immunization: Secondary | ICD-10-CM | POA: Diagnosis not present

## 2023-11-13 DIAGNOSIS — Z79899 Other long term (current) drug therapy: Secondary | ICD-10-CM | POA: Diagnosis not present

## 2023-11-13 DIAGNOSIS — Z6827 Body mass index (BMI) 27.0-27.9, adult: Secondary | ICD-10-CM | POA: Diagnosis not present

## 2023-11-13 DIAGNOSIS — E039 Hypothyroidism, unspecified: Secondary | ICD-10-CM | POA: Diagnosis not present

## 2023-11-13 DIAGNOSIS — Z0000 Encounter for general adult medical examination without abnormal findings: Secondary | ICD-10-CM | POA: Diagnosis not present

## 2024-01-06 DIAGNOSIS — H6992 Unspecified Eustachian tube disorder, left ear: Secondary | ICD-10-CM | POA: Diagnosis not present

## 2024-01-22 DIAGNOSIS — Z6827 Body mass index (BMI) 27.0-27.9, adult: Secondary | ICD-10-CM | POA: Diagnosis not present

## 2024-01-22 DIAGNOSIS — H6993 Unspecified Eustachian tube disorder, bilateral: Secondary | ICD-10-CM | POA: Diagnosis not present

## 2024-01-22 DIAGNOSIS — E039 Hypothyroidism, unspecified: Secondary | ICD-10-CM | POA: Diagnosis not present

## 2024-01-28 ENCOUNTER — Encounter: Payer: Self-pay | Admitting: Oncology

## 2024-01-30 ENCOUNTER — Ambulatory Visit (INDEPENDENT_AMBULATORY_CARE_PROVIDER_SITE_OTHER): Admitting: Audiology

## 2024-01-30 ENCOUNTER — Encounter: Payer: Self-pay | Admitting: Oncology

## 2024-01-30 DIAGNOSIS — H93293 Other abnormal auditory perceptions, bilateral: Secondary | ICD-10-CM

## 2024-01-30 DIAGNOSIS — H903 Sensorineural hearing loss, bilateral: Secondary | ICD-10-CM | POA: Diagnosis not present

## 2024-01-30 DIAGNOSIS — H9190 Unspecified hearing loss, unspecified ear: Secondary | ICD-10-CM

## 2024-01-31 NOTE — Progress Notes (Signed)
  [address], [address] [place], [place] [address] [phone]  Audiological Evaluation    Name: Jillian Hunt     DOB:   [birth year]      MRN:   [id number]                                                                                     Service Date: 01/30/2024     Accompanied by: unaccompanied   Patient comes today after Reyes Cohen, PA-C sent a referral for a hearing evaluation due to concerns with Eustachian tube dysfunction.   Symptoms Yes Details  Hearing loss  []    Tinnitus  []    Ear pain/ infections/pressure  [x]  Pressure in her ears  Balance problems  []    Noise exposure history  []    Previous ear surgeries  []    Family history of hearing loss  []    Amplification  []    Other  []      Otoscopy: Right ear: Clear external ear canal and notable landmarks visualized on the tympanic membrane. Left ear:  Clear external ear canal and notable landmarks visualized on the tympanic membrane.  Tympanometry: Right ear: Type A- Normal external ear canal volume with normal middle ear pressure and tympanic membrane compliance. Left ear: Type A- Normal external ear canal volume with normal middle ear pressure and tympanic membrane compliance.   Pure tone Audiometry: Right ear- Normal hearing from [phone] Hz, then a a mild presumably sensorineural hearing loss from 6000 Hz - 8000 Hz. Left ear-  Normal hearing from [phone] Hz, expect for a mild  sensorineural hearing loss from 3000- Hz - 4000 Hz.  Speech Audiometry: Right ear- Speech Reception Threshold (SRT) was obtained at 15 dBHL. Left ear-Speech Reception Threshold (SRT) was obtained at 20 dBHL.   Word Recognition Score Tested using NU-6 (recorded) Right ear: 100% was obtained at a presentation level of 60 dBHL with contralateral masking which is deemed as  excellent. Left ear: 100% was obtained at a presentation level of 60 dBHL with contralateral masking which is deemed as  excellent.   The hearing test  results were completed under headphones and results are deemed to be of good reliability. Test technique:  conventional      Recommendations: Follow up with ENT as scheduled . Return for a hearing evaluation if concerns with hearing changes arise or per MD recommendation.   Tommey Barret MARIE LEROUX-MARTINEZ, AUD

## 2024-02-03 DIAGNOSIS — C787 Secondary malignant neoplasm of liver and intrahepatic bile duct: Secondary | ICD-10-CM | POA: Diagnosis not present

## 2024-02-03 DIAGNOSIS — M8580 Other specified disorders of bone density and structure, unspecified site: Secondary | ICD-10-CM | POA: Diagnosis not present

## 2024-02-03 DIAGNOSIS — Z170 Estrogen receptor positive status [ER+]: Secondary | ICD-10-CM | POA: Diagnosis not present

## 2024-02-03 DIAGNOSIS — C50811 Malignant neoplasm of overlapping sites of right female breast: Secondary | ICD-10-CM | POA: Diagnosis not present

## 2024-02-03 DIAGNOSIS — C50911 Malignant neoplasm of unspecified site of right female breast: Secondary | ICD-10-CM | POA: Diagnosis not present

## 2024-02-04 ENCOUNTER — Ambulatory Visit (INDEPENDENT_AMBULATORY_CARE_PROVIDER_SITE_OTHER): Admitting: Physician Assistant

## 2024-02-04 ENCOUNTER — Encounter (INDEPENDENT_AMBULATORY_CARE_PROVIDER_SITE_OTHER): Payer: Self-pay | Admitting: Physician Assistant

## 2024-02-04 VITALS — BP 133/86 | HR 80

## 2024-02-04 DIAGNOSIS — H6990 Unspecified Eustachian tube disorder, unspecified ear: Secondary | ICD-10-CM

## 2024-02-04 DIAGNOSIS — H6993 Unspecified Eustachian tube disorder, bilateral: Secondary | ICD-10-CM

## 2024-02-04 NOTE — Progress Notes (Signed)
 Dear Dr. Aisha, Here is my assessment for our mutual patient, Jillian Hunt. Thank you for allowing me the opportunity to care for your patient. Please do not hesitate to contact me should you have any other questions. Sincerely, Chyrl Cohen PA-C  Otolaryngology Clinic Note Referring provider: Dr. Aisha HPI:  Jillian Hunt is a 58 y.o. female kindly referred by Dr. Aisha   The patient is a 58 year old female seen in our office for evaluation of ear congestion.  The patient notes that in April approximate 2 months ago she had an upper respiratory infection and felt like her ears were clogged.  She notes she took Mucinex and Sudafed.  She notes the symptoms did not improve with the over-the-counter medications.  She saw her primary care provider and started on Flonase this month.  She notes she was also given 5 days of oral steroids and Astepro.  She notes that after taking the oral steroids her symptoms resolved but came back the day after she stopped taking the medication.  She went back to her primary care provider who referred her to our office.  She notes she is also tried using insufflation into the nose, she notes this made her dizzy and did not improve the clogged sensation.  She describes it as a pressure-like sensation, no significant pain.  She notes it is worse when talking.  She feels like her ears need to pop.  She is unsure if she has allergies but notes that she does have some postnasal drainage.  She notes the symptoms migrated back and forth between both the ears.  She feels like her hearing is normal, no ringing, no dizziness, no head or neck surgeries, no issues of recurrent ear infections as a child or an adult.     Independent Review of Additional Tests or Records:  Audiological evaluation 01/29/2024  Otoscopy: Right ear: Clear external ear canal and notable landmarks visualized on the tympanic membrane. Left ear:  Clear external ear canal and notable landmarks  visualized on the tympanic membrane.   Tympanometry: Right ear: Type A- Normal external ear canal volume with normal middle ear pressure and tympanic membrane compliance. Left ear: Type A- Normal external ear canal volume with normal middle ear pressure and tympanic membrane compliance.   Pure tone Audiometry: Right ear- Normal hearing from [phone] Hz, then a a mild presumably sensorineural hearing loss from 6000 Hz - 8000 Hz. Left ear-  Normal hearing from [phone], expect for a mild  sensorineural hearing loss from 3000- Hz - 4000 Hz.   Speech Audiometry: Right ear- Speech Reception Threshold (SRT) was obtained at 15 dBHL. Left ear-Speech Reception Threshold (SRT) was obtained at 20 dBHL.   Word Recognition Score Tested using NU-6 (recorded) Right ear: 100% was obtained at a presentation level of 60 dBHL with contralateral masking which is deemed as  excellent. Left ear: 100% was obtained at a presentation level of 60 dBHL with contralateral masking which is deemed as  excellent.   The hearing test results were completed under headphones and results are deemed to be of good reliability. Test technique:  conventional       PMH/Meds/All/SocHx/FamHx/ROS:   Past Medical History:  Diagnosis Date   Basal cell carcinoma of cheek    Breast cancer (HCC) 03/20/2013   right breast   Hashimoto's disease    Thyroid  disease      Past Surgical History:  Procedure Laterality Date   TOTAL LAPAROSCOPIC HYSTERECTOMY WITH BILATERAL SALPINGO OOPHORECTOMY Bilateral  2014 for entry of breast cancer clinical trial    Family History  Problem Relation Age of Onset   Stomach cancer Maternal Aunt        dx in her late 72s   Cancer Paternal Aunt        stomach   Cancer Paternal Aunt        lymph node in neck   Skin cancer Father        either BCC or SCC   Cancer Maternal Aunt        started in enlarged lymph node   Healthy Daughter    Healthy Son      Social Connections: Not on  file      Current Outpatient Medications:    letrozole (FEMARA) 2.5 MG tablet, Take 2.5 mg by mouth daily., Disp: , Rfl:    levothyroxine (SYNTHROID, LEVOTHROID) 50 MCG tablet, Take 50 mcg by mouth daily before breakfast., Disp: , Rfl:    Ribociclib Succinate (KISQALI 600 DOSE PO), Take by mouth., Disp: , Rfl:    Physical Exam:   There were no vitals taken for this visit.  Pertinent Findings  CN II-XII intact Bilateral EAC clear and TM intact with well pneumatized middle ear spaces Weber 512: equal Rinne 512: AC > BC b/l  Anterior rhinoscopy: Septum midline; bilateral inferior turbinates with minimal hypertrophy No lesions of oral cavity/oropharynx; dentition within normal limits No obviously palpable neck masses/lymphadenopathy/thyromegaly No respiratory distress or stridor  Seprately Identifiable Procedures:  None  Impression & Plans:  Irvin Lizama is a 58 y.o. female with the following   Eustachian tube dysfunction-  58 year old female seen in our office for evaluation of ear fullness.  Her symptoms are most consistent with eustachian tube dysfunction.  Reassuring audiogram with normal tympanometry.  No obvious effusion on exam.  I would recommend a trial of medical management including Flonase, nasal saline irrigation, antihistamines, she can continue using the Astepro.  I like to see her back in the office in 2 months for repeat evaluation or sooner as needed.  She verbalized understanding and agreement to today's plan.   - f/u 32-month follow-up   Thank you for allowing me the opportunity to care for your patient. Please do not hesitate to contact me should you have any other questions.  Sincerely, Chyrl Cohen PA-C [facility] Phone: [phone] Fax: [phone]  02/04/2024, 8:26 AM

## 2024-02-11 ENCOUNTER — Encounter: Payer: Self-pay | Admitting: Audiology

## 2024-02-19 DIAGNOSIS — K222 Esophageal obstruction: Secondary | ICD-10-CM | POA: Diagnosis not present

## 2024-02-19 DIAGNOSIS — K317 Polyp of stomach and duodenum: Secondary | ICD-10-CM | POA: Diagnosis not present

## 2024-02-19 DIAGNOSIS — K449 Diaphragmatic hernia without obstruction or gangrene: Secondary | ICD-10-CM | POA: Diagnosis not present

## 2024-02-19 DIAGNOSIS — Z860100 Personal history of colon polyps, unspecified: Secondary | ICD-10-CM | POA: Diagnosis not present

## 2024-02-19 DIAGNOSIS — K3189 Other diseases of stomach and duodenum: Secondary | ICD-10-CM | POA: Diagnosis not present

## 2024-02-19 DIAGNOSIS — Z09 Encounter for follow-up examination after completed treatment for conditions other than malignant neoplasm: Secondary | ICD-10-CM | POA: Diagnosis not present

## 2024-04-10 ENCOUNTER — Encounter (INDEPENDENT_AMBULATORY_CARE_PROVIDER_SITE_OTHER): Payer: Self-pay | Admitting: Physician Assistant

## 2024-04-10 ENCOUNTER — Ambulatory Visit (INDEPENDENT_AMBULATORY_CARE_PROVIDER_SITE_OTHER): Admitting: Physician Assistant

## 2024-04-10 VITALS — BP 134/88 | HR 84 | Ht 65.75 in | Wt 170.0 lb

## 2024-04-10 DIAGNOSIS — H9390 Unspecified disorder of ear, unspecified ear: Secondary | ICD-10-CM

## 2024-04-10 DIAGNOSIS — R0982 Postnasal drip: Secondary | ICD-10-CM

## 2024-04-10 DIAGNOSIS — J309 Allergic rhinitis, unspecified: Secondary | ICD-10-CM

## 2024-04-10 MED ORDER — AZELASTINE HCL 0.1 % NA SOLN: 2.0000 | Freq: Two times a day (BID) | NASAL | 12 refills | Status: AC

## 2024-04-10 NOTE — Progress Notes (Signed)
 Dear Dr. Aisha, Here is my assessment for our mutual patient, Jillian Hunt. Thank you for allowing me the opportunity to care for your patient. Please do not hesitate to contact me should you have any other questions. Sincerely, Chyrl Cohen PA-C  Otolaryngology Clinic Note Referring provider: Dr. Aisha HPI:  Jillian Hunt is a 58 y.o. female kindly referred by Dr. Aisha   The patient is a 58 year old female seen in our office for follow-up evaluation of ear fullness.  The patient was last seen in the office on 02/04/2024.  Below is a recap of that encounter.  The patient is a 58 year old female seen in our office for evaluation of ear congestion.  The patient notes that in April approximate 2 months ago she had an upper respiratory infection and felt like her ears were clogged.  She notes she took Mucinex and Sudafed.  She notes the symptoms did not improve with the over-the-counter medications.  She saw her primary care provider and started on Flonase this month.  She notes she was also given 5 days of oral steroids and Astepro .  She notes that after taking the oral steroids her symptoms resolved but came back the day after she stopped taking the medication.  She went back to her primary care provider who referred her to our office.  She notes she is also tried using insufflation into the nose, she notes this made her dizzy and did not improve the clogged sensation.  She describes it as a pressure-like sensation, no significant pain.  She notes it is worse when talking.  She feels like her ears need to pop.  She is unsure if she has allergies but notes that she does have some postnasal drainage.  She notes the symptoms migrated back and forth between both the ears.  She feels like her hearing is normal, no ringing, no dizziness, no head or neck surgeries, no issues of recurrent ear infections as a child or an adult.  She denies any ear itchiness.  No pain of the TMJ.   Update  04/10/2024.  Since her last office visit she denies any significant changes, she did have an episode of some cracking in the right ear and felt like her ear was gena pop but it did not.  She notes that the fullness does fluctuate from side-to-side.  She notes she has used the daily antihistamines, nasal saline emigration and Flonase with no significant improvement in her symptoms.  She notes no significant nasal congestion but does note some throat clearing.      Independent Review of Additional Tests or Records:  None   PMH/Meds/All/SocHx/FamHx/ROS:   Past Medical History:  Diagnosis Date   Basal cell carcinoma of cheek    Breast cancer (HCC) 03/20/2013   right breast   Hashimoto's disease    Thyroid  disease      Past Surgical History:  Procedure Laterality Date   TOTAL LAPAROSCOPIC HYSTERECTOMY WITH BILATERAL SALPINGO OOPHORECTOMY Bilateral    2014 for entry of breast cancer clinical trial    Family History  Problem Relation Age of Onset   Stomach cancer Maternal Aunt        dx in her late 28s   Cancer Paternal Aunt        stomach   Cancer Paternal Aunt        lymph node in neck   Skin cancer Father        either BCC or SCC   Cancer Maternal Aunt  started in enlarged lymph node   Healthy Daughter    Healthy Son      Social Connections: Not on file      Current Outpatient Medications:    letrozole (FEMARA) 2.5 MG tablet, Take 2.5 mg by mouth daily., Disp: , Rfl:    levothyroxine (SYNTHROID, LEVOTHROID) 50 MCG tablet, Take 50 mcg by mouth daily before breakfast., Disp: , Rfl:    Ribociclib Succinate (KISQALI 600 DOSE PO), Take by mouth., Disp: , Rfl:    Physical Exam:   BP 134/88 (BP Location: Right Arm, Patient Position: Sitting, Cuff Size: Normal)   Pulse 84   Ht 5' 5.75 (1.67 m)   Wt 170 lb (77.1 kg)   SpO2 98%   BMI 27.65 kg/m   Pertinent Findings  CN II-XII intact Bilateral EAC clear and TM intact with well pneumatized middle ear  spaces Anterior rhinoscopy: Septum midline; bilateral inferior turbinates with minimal hypertrophy No lesions of oral cavity/oropharynx; dentition within normal limits No obviously palpable neck masses/lymphadenopathy/thyromegaly No respiratory distress or stridor No pain with palpation of the TMJ, no crepitus  Seprately Identifiable Procedures:  None  Impression & Plans:  Jillian Hunt is a 57 y.o. female with the following   Ear fullness-  This is a 58 year old female seen in our office for follow-up evaluation of ear fullness.  I do have suspicion for eustachian tube dysfunction although she has had no significant improvement with antihistamines, nasal steroids, and saline irrigation.  She had reassuring audiological evaluation with normal tympanograms and symmetric sensorineural hearing.  She continues to have some postnasal drainage despite the above medications, I have added azelastine .  If she has persistent symptoms despite medication management I would recommend evaluation from allergy and asthma.  If her symptoms continue to persist she would be a candidate for repeat audiological evaluation to assure no significant changes.  If audio is again reassuring I am not sure if we have any further options for her.  I do not feel she would be a good candidate for tympanostomy tubes or eustachian tube dilation given her symptoms and audiological evaluations.  The patient understands this, she will reach out when she has completed the above.     - f/u patient will reach out once completing the above therapies   Thank you for allowing me the opportunity to care for your patient. Please do not hesitate to contact me should you have any other questions.  Sincerely, Chyrl Cohen PA-C [facility] Phone: [phone] Fax: [phone]  04/10/2024, 1:56 PM

## 2024-04-30 ENCOUNTER — Encounter: Payer: Self-pay | Admitting: Oncology

## 2024-05-25 ENCOUNTER — Other Ambulatory Visit: Payer: Self-pay

## 2024-05-25 ENCOUNTER — Encounter: Payer: Self-pay | Admitting: Internal Medicine

## 2024-05-25 ENCOUNTER — Ambulatory Visit: Admitting: Internal Medicine

## 2024-05-25 VITALS — BP 118/84 | HR 70 | Temp 97.9°F | Ht 66.14 in | Wt 173.4 lb

## 2024-05-25 DIAGNOSIS — R0982 Postnasal drip: Secondary | ICD-10-CM

## 2024-05-25 DIAGNOSIS — J310 Chronic rhinitis: Secondary | ICD-10-CM

## 2024-05-25 DIAGNOSIS — H6993 Unspecified Eustachian tube disorder, bilateral: Secondary | ICD-10-CM

## 2024-05-25 MED ORDER — XHANCE 93 MCG/ACT NA EXHU: 2.0000 | INHALANT_SUSPENSION | Freq: Two times a day (BID) | NASAL | 3 refills | Status: AC | PRN

## 2024-05-25 NOTE — Patient Instructions (Signed)
 Chronic nasal congestion and eustachian tube dysfunction Chronic nasal congestion and eustachian tube dysfunction since June, with symptoms of ear fullness, throat clearing, and intermittent nasal congestion. Previous treatments with azelastine , Flonase, Astepro , and prednisone were ineffective. Differential includes congestion-related eustachian tube dysfunction. - Prescribe Xhance for nasal congestion.2 sprays in each nostril daily - Recommend Afrin nasal spray for no more than three days to relieve congestion.2 sprays 2-3 times daily x 3 days. Use prior to [place]. - use azelastine  after Xhance-2 sprays twice daily - Instruct on proper nasal spray technique to improve delivery.  Suspected allergic rhinitis Suspected allergic rhinitis contributing to chronic nasal congestion and eustachian tube dysfunction. Previous treatments with antihistamines and nasal sprays were ineffective. Plan to conduct allergy testing to determine if allergies are contributing to symptoms. Discussed the process and implications of allergy testing, including potential for perennial allergies. - Schedule allergy testing for next Monday. - Instruct to avoid antihistamines for three days prior to allergy testing.  Follow up : next Monday October 27th at 3 PM-must stop antihistamines for 3 days prior to visit. (1-55) It was a pleasure meeting you in clinic today! Thank you for allowing me to participate in your care.  Rocky Endow, MD [facility]

## 2024-05-25 NOTE — Progress Notes (Signed)
 NEW PATIENT Date of Service/Encounter:   05/25/2024 Referring provider: Palmer Purchase, PA-C Primary care provider: Aisha Harvey, MD  Subjective:  Jillian Hunt is a 58 y.o. female with a PMHx of breast cancer presenting today for evaluation of eustachian tube dysfunction and chronic rhinitis. History obtained from: chart review and patient.   Discussed the use of AI scribe software for clinical note transcription with the patient, who gave verbal consent to proceed.  History of Present Illness Jillian Hunt is a 58 year old female with asthma who presents with persistent ear congestion and throat clearing. She was referred by an ear, nose, and throat doctor for evaluation of persistent congestion and ear fullness.  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 Plan-allergy referral; azelastine  added  Other allergy screening: Food allergy: no Medication allergy: no Hymenoptera allergy: no Urticaria: no Eczema:no History of recurrent infections suggestive of immunodeficency: no  Past Medical History: Past Medical History:  Diagnosis Date   Basal cell carcinoma of cheek     Breast cancer (HCC) 03/20/2013   right breast   Hashimoto's disease    Thyroid  disease    Medication List:  Current Outpatient Medications  Medication Sig Dispense Refill   Acetaminophen 500 MG capsule Take 1,000 mg by mouth as needed.     azelastine  (ASTELIN ) 0.1 % nasal spray Place 2 sprays into both nostrils 2 (two) times daily. Use in each nostril as directed 30 mL 12   letrozole (FEMARA) 2.5 MG tablet Take 2.5 mg by mouth daily.     levothyroxine (SYNTHROID, LEVOTHROID) 50 MCG tablet Take 50 mcg by mouth daily before breakfast.     Ribociclib Succinate (KISQALI 600 DOSE PO) Take by mouth.     No current facility-administered medications for this visit.   Known Allergies:  Allergies  Allergen Reactions   Sulfa Antibiotics Hives and Itching   Past Surgical History: Past Surgical History:  Procedure Laterality Date   TOTAL LAPAROSCOPIC HYSTERECTOMY WITH BILATERAL SALPINGO OOPHORECTOMY Bilateral    2014 for entry of breast cancer clinical trial   Family History: Family History  Problem Relation Age of Onset   Stomach cancer Maternal Aunt        dx in her late 38s   Cancer Paternal Aunt        stomach   Cancer Paternal Aunt        lymph node in neck   Skin cancer Father        either BCC or SCC   Cancer Maternal Aunt        started in enlarged lymph node   Healthy Daughter    Healthy Son    Social History: Ary lives in a house built 6 years ago, + water damage, hardwood floors, gasoline, central AC,  2 cats, no roaches, Neisen dust mite covers in the bed of the pillows.  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  [place]   ROS:  All other systems negative except as noted per HPI.  Objective:  Blood pressure 118/84, pulse 70, temperature 97.9 F (36.6 C), height 5' 6.14 (1.68 m), weight 173 lb 6.4 oz (78.7 kg), SpO2 98%. Body mass index is 27.87 kg/m. Physical  Exam:  General Appearance:  Alert, cooperative, no distress, appears stated age  Head:  Normocephalic, without obvious abnormality, atraumatic  Eyes:  Conjunctiva clear, EOM's intact  Ears EACs normal bilaterally and normal TMs bilaterally  Nose: Nares normal, normal mucosa and no visible anterior polyps  Throat: Lips, tongue normal; teeth and gums normal, normal posterior oropharynx  Neck: Supple, symmetrical  Lungs:   clear to auscultation bilaterally, Respirations unlabored, no coughing  Heart:  regular rate and rhythm and no murmur, Appears well perfused  Extremities: No edema  Skin: Skin color, texture, turgor normal and no rashes or lesions on visualized portions of skin  Neurologic: No gross deficits   Diagnostics:   Labs:  Lab Orders  No laboratory test(s) ordered today     Assessment and Plan  Assessment and Plan Assessment & Plan Chronic nasal congestion and eustachian tube dysfunction Chronic nasal congestion and eustachian tube dysfunction since June, with symptoms of ear fullness, throat clearing, and intermittent nasal congestion. Previous treatments with azelastine , Flonase, Astepro , and prednisone were ineffective. Differential includes congestion-related eustachian tube dysfunction. - Prescribe Xhance for nasal congestion.2 sprays in each nostril daily - Recommend Afrin nasal spray for no more than three days to relieve congestion.2 sprays 2-3 times daily x 3 days. Use prior to [place]. - use azelastine  after Xhance-2 sprays twice daily - Instruct on proper nasal spray technique to improve delivery.  Suspected allergic rhinitis Suspected allergic rhinitis contributing to chronic nasal congestion and eustachian tube dysfunction. Previous treatments with antihistamines and nasal sprays were ineffective. Plan to conduct allergy testing to determine if allergies are contributing to symptoms. Discussed the process and implications of allergy testing, including potential  for perennial allergies. - Schedule allergy testing for next Monday. - Instruct to avoid antihistamines for three days prior to allergy testing.  Follow up : next Monday October 27th at 3 PM-must stop antihistamines for 3 days prior to visit. (1-55) It was a pleasure meeting you in clinic today! Thank you for allowing me to participate in your care.  Rocky Endow, MD [facility]        This note in its entirety was forwarded to the Provider who requested this consultation.  Other: samples of Xhance provided  Thank you for your kind referral. I appreciate the opportunity to take part in Eviana's care. Please do not hesitate to contact me with questions.  Sincerely,  Rocky Endow, MD [facility] 

## 2024-06-01 ENCOUNTER — Encounter: Payer: Self-pay | Admitting: Internal Medicine

## 2024-06-01 ENCOUNTER — Ambulatory Visit: Admitting: Internal Medicine

## 2024-06-01 DIAGNOSIS — J301 Allergic rhinitis due to pollen: Secondary | ICD-10-CM | POA: Diagnosis not present

## 2024-06-01 NOTE — Progress Notes (Signed)
 Date of Service/Encounter:  06/01/24  Allergy testing appointment   Initial visit on 05/25/24, seen for chronic rhinitis with eustachian tube dysfunction.  Please see that note for additional details.  Today reports for allergy diagnostic testing:    DIAGNOSTICS:  Skin Testing: Environmental allergy panel. Adequate positive and negative controls. Results discussed with patient/family.  Airborne Adult Perc - 06/01/24 1501     Time Antigen Placed 1501    Allergen Manufacturer Jestine    Location Back    Number of Test 55    1. Control-Buffer 50% Glycerol Negative    2. Control-Histamine 3+    3. Bahia Negative    4. Bermuda Negative    5. Johnson Negative    6. [place]  Blue Negative    7. Meadow Fescue Negative    8. Perennial Rye Negative    9. Timothy Negative    10. Ragweed Mix Negative    11. Cocklebur Negative    12. Plantain,  English Negative    13. Baccharis Negative    14. Dog Fennel Negative    15. Russian Thistle Negative    16. Lamb's Quarters Negative    17. Sheep Sorrell Negative    18. Rough Pigweed Negative    19. Marsh Elder, Rough Negative    20. Mugwort, Common Negative    21. Box, Elder Negative    22. Cedar, red Negative    23. Sweet Gum Negative    24. Pecan Pollen Negative    25. Pine Mix Negative    26. Walnut, Black Pollen Negative    27. Red Mulberry Negative    28. Ash Mix Negative    29. Birch Mix 3+    30. Beech American Negative    31. Cottonwood, Eastern Negative    32. Hickory, White Negative    33. Maple Mix Negative    34. Oak, Eastern Mix 3+    35. Sycamore Eastern Negative    36. Alternaria Alternata Negative    37. Cladosporium Herbarum Negative    38. Aspergillus Mix Negative    39. Penicillium Mix Negative    40. Bipolaris Sorokiniana (Helminthosporium) Negative    41. Drechslera Spicifera (Curvularia) Negative    42. Mucor Plumbeus Negative    43. Fusarium Moniliforme Negative    44. Aureobasidium Pullulans  (pullulara) Negative    45. Rhizopus Oryzae Negative    46. Botrytis Cinera Negative    47. Epicoccum Nigrum Negative    48. Phoma Betae Negative    49. Dust Mite Mix Negative    50. Cat Hair 10,000 BAU/ml Negative    51.  Dog Epithelia Negative    52. Mixed Feathers Negative    53. Horse Epithelia Negative    54. Cockroach, German Negative    55. Tobacco Leaf Negative          Intradermal - 06/01/24 1556     Time Antigen Placed 9648    Allergen Manufacturer Jestine    Location Arm    Number of Test 15    Control Negative    Bahia Negative    Bermuda Negative    Johnson Negative    7 Grass Negative    Ragweed Mix Negative    Weed Mix Negative    Mold 1 Negative    Mold 2 Negative    Mold 3 Negative    Mold 4 Negative    Mite Mix Negative    Cat Negative    Dog Negative  Cockroach Negative          Allergy testing results were read and interpreted by myself, documented by clinical staff.  Patient provided with copy of allergy testing along with avoidance measures when indicated.   Rocky Endow, MD  [facility]     -------------------------------------------------------- Mixed rhinitis with seasonal allergic component; year round symptoms-suspect mostly nonallergic Previous treatments with antihistamines and nasal sprays were ineffective.  - Skin testing for environmental allergies 06/01/24: positive to birch and oak trees only; intradermal testing negative; allergen avoidance discussed. - discussed that her allergen testing indicates seasonal spring allergens which are unlikely causing her current year round ETD; allergen injections available but unlikely to help her current symptoms significantly - continue ENT follow-up  Follow up : as needed It was a pleasure seeing you again in clinic today! Thank you for allowing me to participate in your care.

## 2024-06-01 NOTE — Patient Instructions (Addendum)
 Chronic nasal congestion and eustachian tube dysfunction Chronic nasal congestion and eustachian tube dysfunction since June, with symptoms of ear fullness, throat clearing, and intermittent nasal congestion. Previous treatments with azelastine , Flonase, Astepro , and prednisone were ineffective. Differential includes congestion-related eustachian tube dysfunction. [place] [employer] for nasal congestion.2 sprays in each nostril daily - Recommend Afrin nasal spray for no more than three days to relieve congestion.2 sprays 2-3 times daily x 3 days. Use prior to [place]. Okay to use once or twice a month if symptoms really bothersome. - use azelastine  after Xhance-2 sprays twice daily - proper nasal technique: point towards ear on same side  Mixed rhinitis with seasonal allergic component; year round symptoms-suspect mostly nonallergic Previous treatments with antihistamines and nasal sprays were ineffective.  - Skin testing for environmental allergies 06/01/24: positive to birch and oak trees only; intradermal testing negative; allergen avoidance discussed. - discussed that her allergen testing indicates seasonal spring allergens which are unlikely causing her current year round ETD; allergen injections available but unlikely to help her current symptoms significantly - continue ENT follow-up  Follow up : as needed It was a pleasure seeing you again in clinic today! Thank you for allowing me to participate in your care.  Rocky Endow, MD [facility]

## 2024-06-12 ENCOUNTER — Ambulatory Visit (INDEPENDENT_AMBULATORY_CARE_PROVIDER_SITE_OTHER): Admitting: Physician Assistant

## 2024-07-27 DIAGNOSIS — Z23 Encounter for immunization: Secondary | ICD-10-CM | POA: Diagnosis not present
